# Patient Record
Sex: Female | Born: 1980 | Race: Black or African American | Hispanic: No | Marital: Single | Smoking: Never smoker
Health system: Southern US, Community
[De-identification: ages and names within clinical notes are randomized; demographics above are authoritative.]

## PROBLEM LIST (undated history)

## (undated) DIAGNOSIS — D219 Benign neoplasm of connective and other soft tissue, unspecified: Secondary | ICD-10-CM

## (undated) DIAGNOSIS — E785 Hyperlipidemia, unspecified: Secondary | ICD-10-CM

## (undated) DIAGNOSIS — D259 Leiomyoma of uterus, unspecified: Secondary | ICD-10-CM

## (undated) DIAGNOSIS — N920 Excessive and frequent menstruation with regular cycle: Secondary | ICD-10-CM

## (undated) HISTORY — DX: Hyperlipidemia, unspecified: E78.5

## (undated) HISTORY — DX: Leiomyoma of uterus, unspecified: D25.9

## (undated) HISTORY — DX: Excessive and frequent menstruation with regular cycle: N92.0

## (undated) HISTORY — DX: Benign neoplasm of connective and other soft tissue, unspecified: D21.9

---

## 2010-01-28 ENCOUNTER — Ambulatory Visit: Payer: Self-pay | Admitting: Family Medicine

## 2011-04-10 HISTORY — PX: ENDOMETRIAL BIOPSY: SHX622

## 2011-06-13 ENCOUNTER — Ambulatory Visit: Payer: Self-pay | Admitting: Obstetrics and Gynecology

## 2011-10-24 HISTORY — PX: UTERINE FIBROID SURGERY: SHX826

## 2012-07-20 ENCOUNTER — Emergency Department: Payer: Self-pay | Admitting: *Deleted

## 2012-07-20 LAB — URINALYSIS, COMPLETE
Bilirubin,UR: NEGATIVE
Blood: NEGATIVE
Glucose,UR: NEGATIVE mg/dL (ref 0–75)
Ketone: NEGATIVE
Ph: 5 (ref 4.5–8.0)
RBC,UR: 2 /HPF (ref 0–5)
Squamous Epithelial: 13
WBC UR: 17 /HPF (ref 0–5)

## 2012-07-20 LAB — COMPREHENSIVE METABOLIC PANEL
Albumin: 3.2 g/dL — ABNORMAL LOW (ref 3.4–5.0)
Alkaline Phosphatase: 63 U/L (ref 50–136)
Anion Gap: 9 (ref 7–16)
BUN: 10 mg/dL (ref 7–18)
Calcium, Total: 8.5 mg/dL (ref 8.5–10.1)
Creatinine: 1.08 mg/dL (ref 0.60–1.30)
Glucose: 89 mg/dL (ref 65–99)
Potassium: 3.8 mmol/L (ref 3.5–5.1)
SGOT(AST): 17 U/L (ref 15–37)
SGPT (ALT): 17 U/L (ref 12–78)
Total Protein: 8 g/dL (ref 6.4–8.2)

## 2012-07-20 LAB — LIPASE, BLOOD: Lipase: 116 U/L (ref 73–393)

## 2012-07-20 LAB — CBC
HCT: 37.1 % (ref 35.0–47.0)
HGB: 12.1 g/dL (ref 12.0–16.0)
MCH: 27.3 pg (ref 26.0–34.0)
MCHC: 32.6 g/dL (ref 32.0–36.0)
MCV: 84 fL (ref 80–100)

## 2012-07-20 LAB — PREGNANCY, URINE: Pregnancy Test, Urine: NEGATIVE m[IU]/mL

## 2012-08-19 ENCOUNTER — Ambulatory Visit: Payer: Self-pay | Admitting: Obstetrics and Gynecology

## 2012-08-30 ENCOUNTER — Inpatient Hospital Stay: Payer: Self-pay | Admitting: Obstetrics and Gynecology

## 2012-08-31 LAB — HEMATOCRIT: HCT: 32.8 % — ABNORMAL LOW (ref 35.0–47.0)

## 2012-09-02 LAB — PATHOLOGY REPORT

## 2014-01-20 IMAGING — CT CT ABD-PELV W/O CM
1 of 2 series · 15 of 32 positions shown, 19 images · non-contrast
Comparison: None

REASON FOR EXAM: (1) pain; (2) pain
COMMENTS:

PROCEDURE:     CT  - CT ABDOMEN AND PELVIS W[DATE]  [DATE]
RESULT:     Indication: Pain
TECHNIQUE: Multiple axial images from the lung bases to the symphysis pubis
were obtained without oral and without intravenous contrast.

[Series 2: 3mm soft tissue · axial · 0.84mm/px · z∈[-376,+72]mm · 15 of 163 slices shown, 19 images]
[im 7/163  soft-tissue]
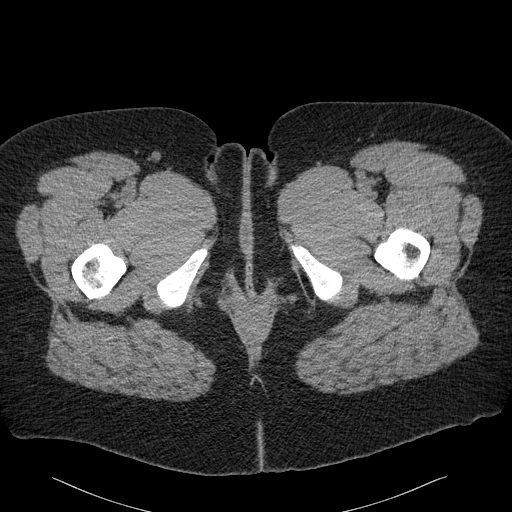
[im 7/163  bone]
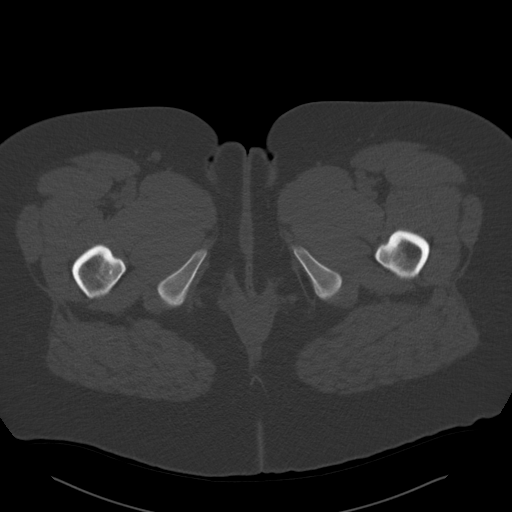
[im 21/163  soft-tissue]
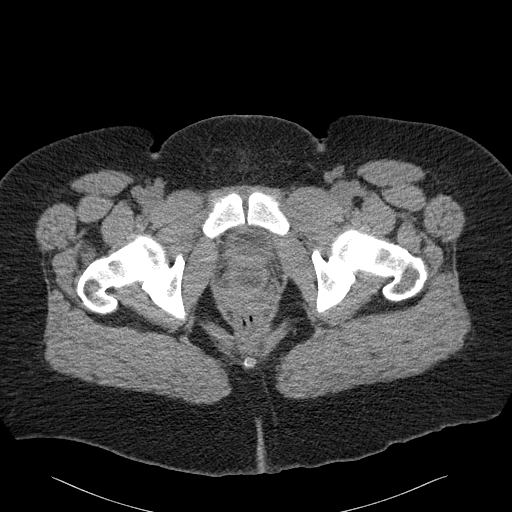
[im 34/163  soft-tissue]
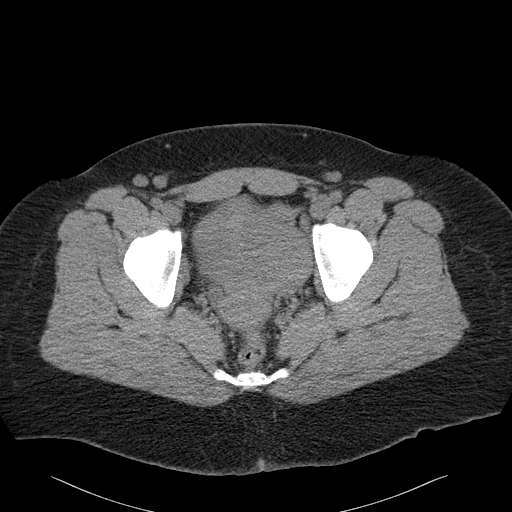
[im 48/163  soft-tissue]
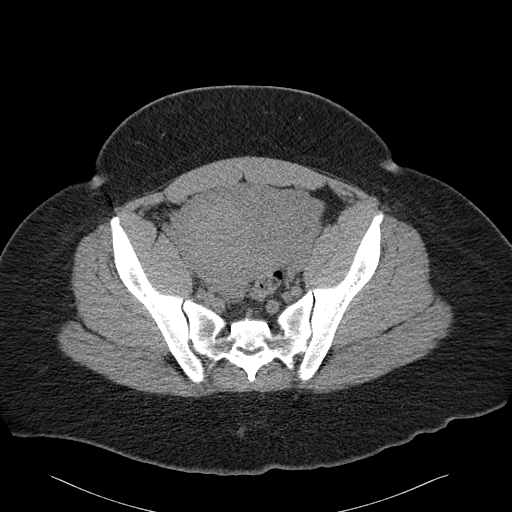
[im 55/163  soft-tissue]
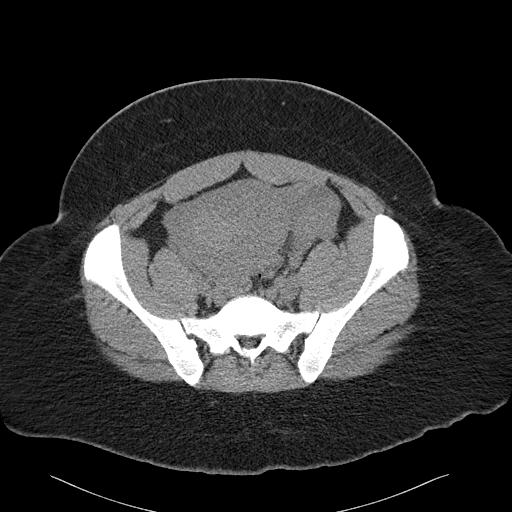
[im 68/163  soft-tissue]
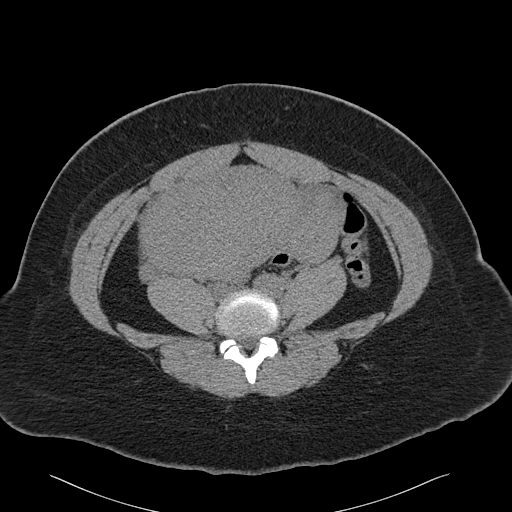
[im 82/163  soft-tissue]
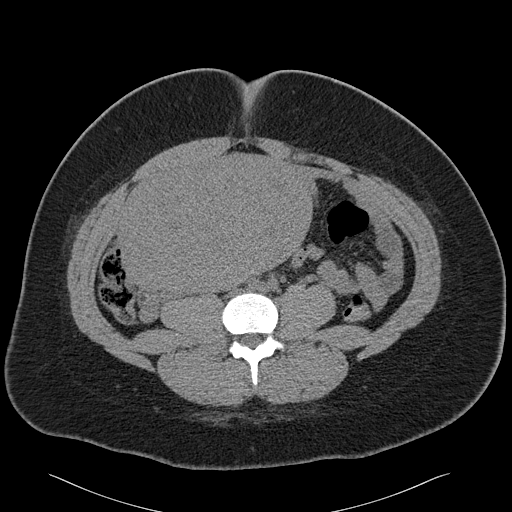
[im 95/163  soft-tissue]
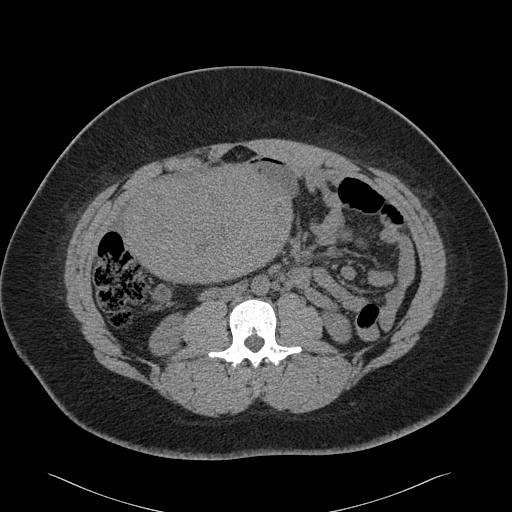
[im 109/163  soft-tissue]
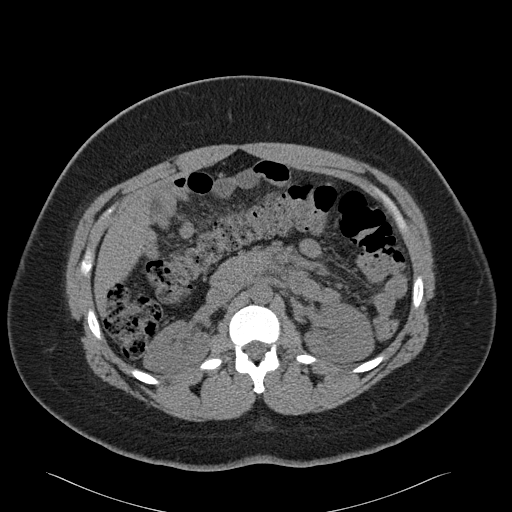
[im 109/163  bone]
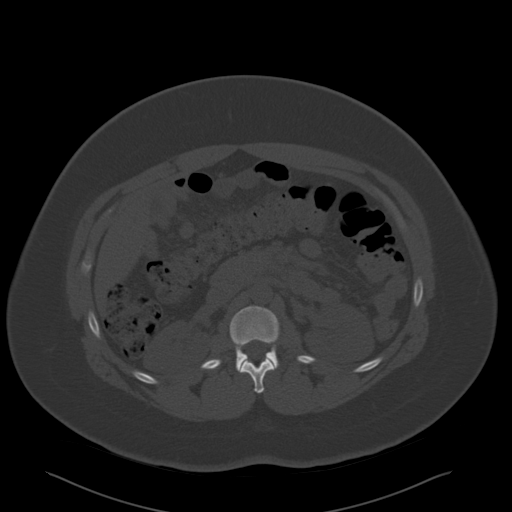
[im 115/163  soft-tissue]
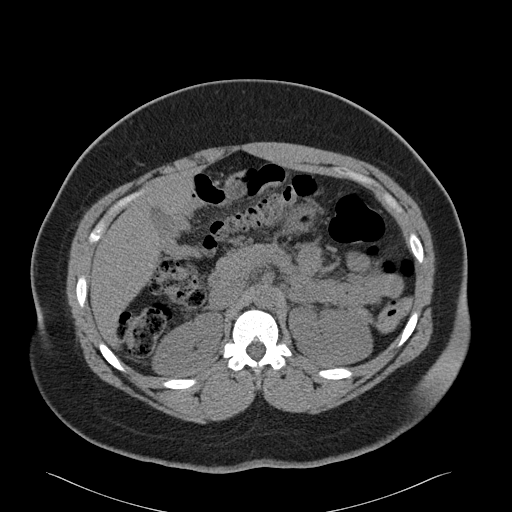
[im 129/163  soft-tissue]
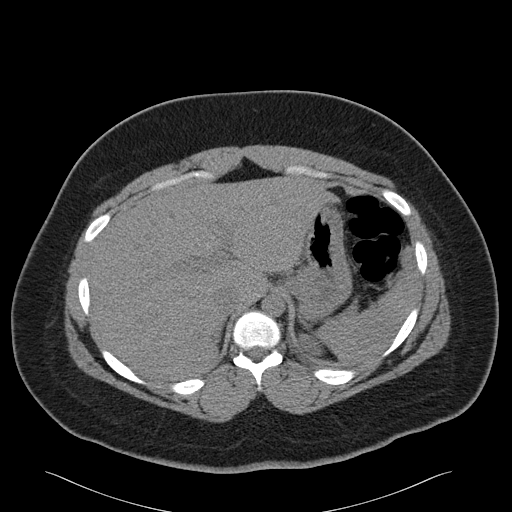
[im 136/163  lung]
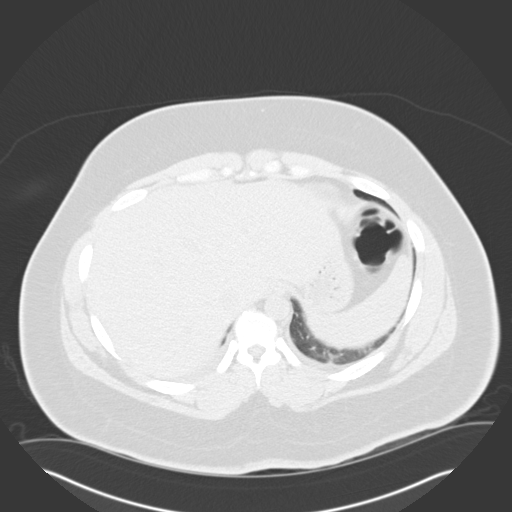
[im 142/163  soft-tissue]
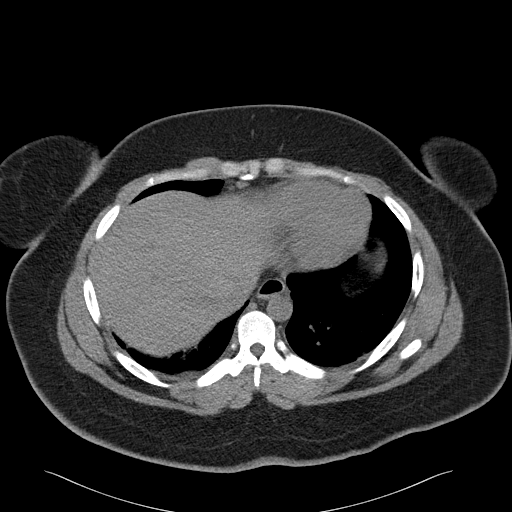
[im 142/163  lung]
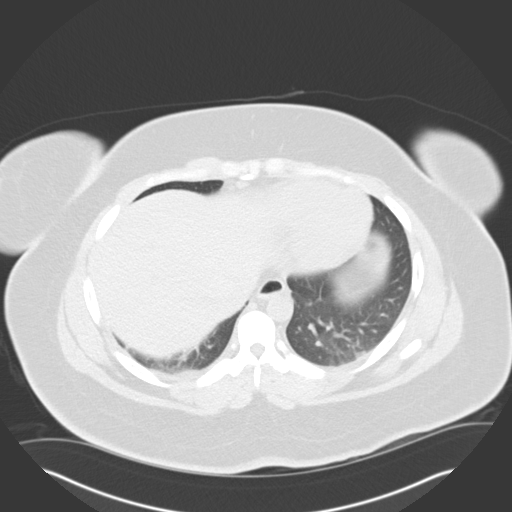
[im 149/163  lung]
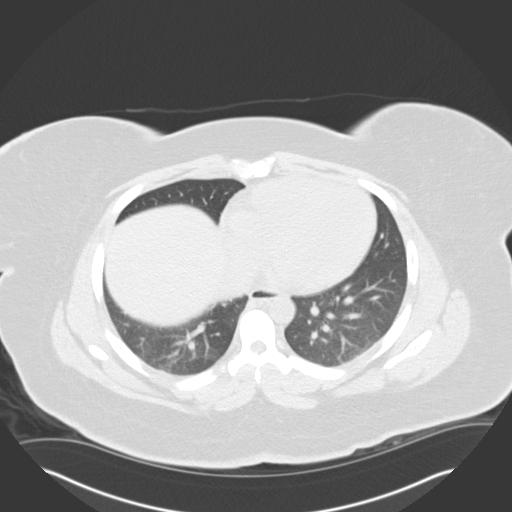
[im 156/163  soft-tissue]
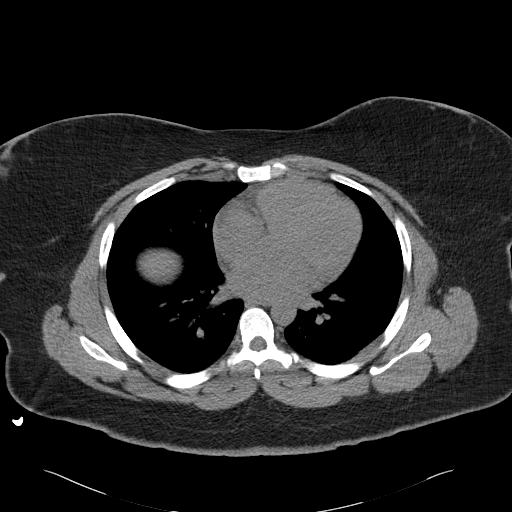
[im 156/163  lung]
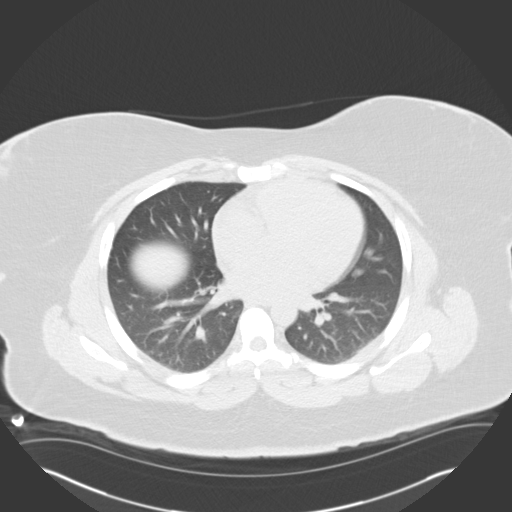

[15 of 32 positions shown; findings below may reference images not displayed]

FINDINGS: The lung bases are clear. There is no pleural or pericardial effusions.

No renal, ureteral, or bladder calculi. No obstructive uropathy. No
perinephric stranding is seen. The kidneys are symmetric in size without
evidence for exophytic mass. The bladder is unremarkable.

The liver demonstrates no focal abnormality. The gallbladder is
unremarkable. The spleen demonstrates no focal abnormality. The adrenal
glands and pancreas are normal.

The unopacified stomach, duodenum, small intestine, and large intestine are
unremarkable, but evaluation is limited by lack of oral contrast. There is a
17 cm a exophytic uterine mass most concerning for a uterine fibroid. There
is a normal caliber appendix in the right lower quadrant without
periappendiceal inflammatory changes. There multiple other smaller uterine
masses. There is no pneumoperitoneum, pneumatosis, or portal venous gas.
There is no abdominal or pelvic free fluid. There is no lymphadenopathy.

The abdominal aorta is normal in caliber .

The osseous structures are unremarkable.
IMPRESSION: 1. Multiple uterine leiomyomas. The largest measures 17 x 10 cm.

[REDACTED]

## 2015-02-09 NOTE — Op Note (Signed)
PATIENT NAME:  Shelley Lee, Shelley Lee MR#:  [id number] DATE OF BIRTH:  05-06-1981  DATE OF PROCEDURE:  08/30/2012  PREOPERATIVE DIAGNOSIS: Fibroids up to the xyphoid process pressing up on the liver causing menometrorrhagia unresponsive to medical management with wanting to preserve the uterus.   POSTOPERATIVE DIAGNOSIS: Fibroids up to the xyphoid process pressing up on the liver causing menometrorrhagia unresponsive to medical management with wanting to preserve the uterus.     PROCEDURE: Open myomectomy.   SURGEON: Erik Obey, MD.   ASSISTANT: Verlene Mayer, MD.   ANESTHESIA:  General endotracheal.   ESTIMATED BLOOD LOSS: Approximately 200 mL.   FINDINGS: Large fibroids weighing between two to three pounds growing up the back side of the uterus with two others, one embedded in the posterior aspect of the uterus, one embedded in the anterior aspect of the uterus and one going off the side in the shape of a heart along with approximately five other small one-inch fibroids.   DESCRIPTION OF PROCEDURE:  The patient was taken to the Operating Room and placed in supine position. After adequate general endotracheal anesthesia was instilled, the patient was prepped and draped in the usual sterile fashion. The fibroids are palpated under anesthesia. A vertical incision was made halfway to the umbilicus. The incision was carried sharply down to the fascia. The fascia was nicked in the midline. The incision was extended in a superior to inferior direction. The edges of the fascia were grasped and the muscle bellies were sharply and bluntly dissected off the fascia until the midline was identified. Midline was identified and the incision was extended. The peritoneum was grasped, cut and entered. The patient was placed in Trendelenburg. Attempts were made to remove the uterus and fibroids from the body. With some struggling, eventually the fibroid with the use of cat claw graspers was able to be hoisted out of  the abdomen. The bowels were packed back densely with moist laparotomy sponges and the uterus was inspected. The areas where the uterus and fibroids were connected were injected with a dilute pitressinsolution to decrease in blood vessels. Scalpel was used with the Bovie to remove the large posterior fibroid and the second heart-shaped fibroid. Attention was then turned to the posterior fibroid that was subserosal and the uterine incision was made with a knife. The fibroid was grasped with a towel clamp and with the backside of a scalpel handle, the fibroid was sharply and bluntly dissected out of its pseudocapsule. Good hemostasis was identified. The anterior fibroid was then identified and again, the uterine incision was made in a vertical manner through the muscle layers to the fibroid. The fibroid was grasped with the towel clapm clip and was sharply and bluntly dissected out of the pseudocapsule. The other side wall small 1 inch fibroids were also identified and cut out of their intramural location. Attention was then turned to the openings where the fibroids were at the anterior and the posterior aspects. Deep sutures were placed with first deep running Vicryl suture to approximate the dead space. Then serial sutures were placed to approximate the second layer. Additional muscle layers were seen and approximated with serial sutures, identified and pulled together. The last layer of each defect in the uterus was used to approximate the muscles at the level of the serosa. Attention was then turned to the back side where the large uterus fibroid had been removed. There was a small amount of endometrium visible. Sutures were used with 4-0 chromic sutures to invert this  tissue back into the uterus and the backside was approximated to cover the defect with two figure-of-eight sutures. Incisions of the patient were identified and the first baseball sutures were placed with the 4-0 Monocryl. Good hemostasis identified  at the end and the uterus looked wonderful with a T-shaped scar identified with one in the anterior aspect of the uterus. The uterus was wrapped in Seprafilm and placed back into the pelvis after copious irrigation with warm normal saline. The patient's bowels were allowed to then fall back over the uterus. All laps were removed. Counts were correct. The peritoneum was approximated as was the muscle bellies and the On-Q pain pump system was placed one on each side of the incision. The fascia was then closed with two PDS sutures. Plain gut suture was then used to approximate the subcutaneous fat and absorbable staple sutures were placed in the upper third to two-thirds of the uterine incision. However, due to the angle of the patient's belly, not able to be done in the lower incision area, so conventional staples were used in this area. Bandage was placed and the patient was then found to be hemostatic on the incision. Clear urine was noted in the Foley bag. The patient was then taken to recovery after having tolerated the procedure well.   ____________________________ Delsa Sale, MD cck:ap D: 09/05/2012 08:35:02 ET T: 09/05/2012 10:58:11 ET JOB#: [id number] cc: Delsa Sale, MD, <Dictator> Delsa Sale MD ELECTRONICALLY SIGNED 09/06/2012 13:12

## 2016-02-01 ENCOUNTER — Ambulatory Visit (INDEPENDENT_AMBULATORY_CARE_PROVIDER_SITE_OTHER): Payer: BC Managed Care – PPO | Admitting: Obstetrics and Gynecology

## 2016-02-01 ENCOUNTER — Encounter: Payer: Self-pay | Admitting: Obstetrics and Gynecology

## 2016-02-01 VITALS — BP 106/64 | HR 72 | Ht 65.0 in | Wt 292.9 lb

## 2016-02-01 DIAGNOSIS — N920 Excessive and frequent menstruation with regular cycle: Secondary | ICD-10-CM

## 2016-02-01 DIAGNOSIS — Z86018 Personal history of other benign neoplasm: Secondary | ICD-10-CM

## 2016-02-01 DIAGNOSIS — S20111A Abrasion of breast, right breast, initial encounter: Secondary | ICD-10-CM

## 2016-02-01 DIAGNOSIS — Z8742 Personal history of other diseases of the female genital tract: Secondary | ICD-10-CM | POA: Diagnosis not present

## 2016-02-01 HISTORY — DX: Excessive and frequent menstruation with regular cycle: N92.0

## 2016-02-01 NOTE — Progress Notes (Signed)
GYNECOLOGY CLINIC PROGRESS NOTE  Subjective:     Patient ID: Shelley Lee is a 35 y.o. G0P0 female who presents to establish care and has the following complaints today:   Chief Complaint: Dysfunctional Uterine Bleeding Patient complains of heavy menses over the past 5-6 months. Menstrual cycles last 6 days. She notes the heaviest bleeding within the first 1-2 days, using approximately 7-8 super pads per day. Clots are quarter sized. Dysmenorrhea:moderate, occurring first 1-2 days of flow. Takes Aleve for relief. Cyclic symptoms include: none. Current contraception: none.  Of note, patient has a history of large fibroids, with abdominal myomectomy performed in 2013. Thinks that her fibroids have returned as she is having similar symptoms as she did in the past.    Additional Complaints: Breast pain Patient presents for evaluation of a breast pain for several months. Pain is non-cyclical, bilaterally, and is located in axillary regions and underneath the breasts.  Patient notes that she has changed bras (now trying to use more sports bras and alternate with regular bras), and has ensured adequate bra sizing (was recently refitted).  Notes that this helped for a short time, however the pain still persists.  Denies personal or family history of breast cancer.   Menstrual History: OB History    Gravida Para Term Preterm AB TAB SAB Ectopic Multiple Living   0 0 0 0 0 0 0 0 0 0       Menarche age:40  Patient's last menstrual period was 02/01/2016 (exact date). Last pap smear ~ 2 years ago, normal.  Denies h/o abnormal pap smears.  Denies h/o STIs.    Past Medical History  Diagnosis Date  . Fibroid     History reviewed. No pertinent family history.  Past Surgical History  Procedure Laterality Date  . Uterine fibroid surgery  2013    Social History   Social History  . Marital Status: Single    Spouse Name: N/A  . Number of Children: N/A  . Years of Education: N/A    Occupational History  . Not on file.   Social History Main Topics  . Smoking status: Never Smoker   . Smokeless tobacco: Not on file  . Alcohol Use: Yes     Comment: Once weekly   . Drug Use: No  . Sexual Activity: Yes    Birth Control/ Protection: Condom   Other Topics Concern  . Not on file   Social History Narrative  . No narrative on file    . No outpatient encounter prescriptions on file as of 02/01/2016.   No facility-administered encounter medications on file as of 02/01/2016.     No Known Allergies   Review of Systems Pertinent items noted in HPI and remainder of comprehensive ROS otherwise negative.    Objective:    BP 106/64 mmHg  Pulse 72  Ht 5\' 5"  (1.651 m)  Wt 292 lb 14.4 oz (132.859 kg)  BMI 48.74 kg/m2  LMP 02/01/2016 (Exact Date) General appearance: alert and no distress, morbidly obese Breasts: No nipple retraction or dimpling, No nipple discharge or bleeding, positive findings: ulceration of right breast near axillary region, ~ 1 cm with peeling of surrounding skin Abdomen: soft, non-tender; bowel sounds normal; no masses,  no organomegaly Pelvic: external genitalia normal, rectovaginal septum normal.  Vagina without discharge. Small amount of dark blood in vaginal vault.  Cervix normal appearing, no lesions and no motion tenderness.  Uterus mobile, nontender, enlarged ~ 12-14 week size.  Adnexae non-palpable,  nontender bilaterally.  Extremities: extremities normal, atraumatic, no cyanosis or edema Skin: Skin color, texture, turgor normal. No rashes or lesions except as noted in breast exam.  Neurologic: Grossly normal   Assessment:   Menorrhagia H/o fibroid uterus Morbid obesity Breast ulceration  Plan:   1) Patient has abnormal uterine bleeding . She has an abnormal exam, with an enlarged uterus, but otherwise no evidence of lesions.  Will order pelvic ultrasound to evaluate for any structural gynecologic abnormalities.  Patient with h/o  fibroid uterus s/p myomectomy.  Uterus enlarged, possibly with newly growing fibroids.  Given handout regarding abnormal uterine bleeding and fibroid uterus.  Will return in 2-3 weeks to discuss ultrasound results and treatment options.  2) Discussion had with patient regarding obesity as a potential contributing factor for abnormal bleeding.  3) Breast ulceration of right breast.  Advised on use of neosporin twice daily, and to keep area covered with bandage.  Also encouraged to get bra pads and to attempt to go bra-less if possible at times. Will f/u at next visit.    Rubie Maid, MD [facility]

## 2016-02-09 ENCOUNTER — Ambulatory Visit (INDEPENDENT_AMBULATORY_CARE_PROVIDER_SITE_OTHER): Payer: BC Managed Care – PPO

## 2016-02-09 DIAGNOSIS — N920 Excessive and frequent menstruation with regular cycle: Secondary | ICD-10-CM

## 2016-02-16 ENCOUNTER — Ambulatory Visit (INDEPENDENT_AMBULATORY_CARE_PROVIDER_SITE_OTHER): Payer: BC Managed Care – PPO | Admitting: Obstetrics and Gynecology

## 2016-02-16 ENCOUNTER — Encounter: Payer: Self-pay | Admitting: Obstetrics and Gynecology

## 2016-02-16 VITALS — BP 105/70 | HR 66 | Wt 291.4 lb

## 2016-02-16 DIAGNOSIS — D252 Subserosal leiomyoma of uterus: Secondary | ICD-10-CM

## 2016-02-16 DIAGNOSIS — N920 Excessive and frequent menstruation with regular cycle: Secondary | ICD-10-CM | POA: Diagnosis not present

## 2016-02-16 MED ORDER — LEVONORGEST-ETH ESTRAD 91-DAY 0.15-0.03 MG PO TABS
1.0000 | ORAL_TABLET | Freq: Every day | ORAL | Status: DC
Start: 2016-02-16 — End: 2017-03-05

## 2016-02-18 ENCOUNTER — Encounter: Payer: Self-pay | Admitting: Obstetrics and Gynecology

## 2016-02-18 DIAGNOSIS — D259 Leiomyoma of uterus, unspecified: Secondary | ICD-10-CM | POA: Insufficient documentation

## 2016-02-18 HISTORY — DX: Leiomyoma of uterus, unspecified: D25.9

## 2016-02-18 NOTE — Progress Notes (Signed)
    GYNECOLOGY PROGRESS NOTE  Subjective:    Patient ID: Shelley Lee, female    DOB: Aug 10, 1981, 35 y.o.   MRN: [id number]  HPI  Patient is a 35 y.o. G0P0000 female who presents for f/u after ultrasound for complaints of menorrhagia with regular cycle. Denies complaints today.   The following portions of the patient's history were reviewed and updated as appropriate: allergies, current medications, past family history, past medical history, past social history, past surgical history and problem list.  Review of Systems Pertinent items noted in HPI and remainder of comprehensive ROS otherwise negative.   Objective:   Blood pressure 105/70, pulse 66, weight 291 lb 7 oz (132.195 kg), last menstrual period 02/01/2016. General appearance: alert and no distress Remainder of exam deferred.     Imaging (02/09/2016):  Indications: Menorrhagia with regular cycles Findings:  The uterus measures 10.6 x 6.5 x 8.4 cm, enlarged (nullipara). Echo texture is diffusely heterogenous with evidence of at least 5 focal masses. Within the uterus are multiple suspected fibroids measuring (largest 3 measured): Fibroid 1: Posterior midline, appears subserosal, 2.7 x 2.5 x 2.5 cm with internal flow seen. Fibroid 2: Posterior, right, possibly submucosal as it appears to be pushing the endometrium anteriorly, 5.4 x 4.9 x 5.4 cm with internal flow seen. Fibroid 3: Anterior left, appears subserosal, 2.5 x 2.1 x 2.0 cm, also with internal flow seen.  The Endometrium appears thin and measures 3.3 mm.  Right Ovary measures 3.1 x 1.8 x 2.6 cm, and appears WNL. Left Ovary measures 3.7 x 2.4 x 3.0 cm, and appears WNL. Survey of the adnexa demonstrates no adnexal masses. There is no free fluid in the cul de sac.  Impression: 1. Enlarged uterus with multiple fibroids seen. At least one appears to possibly be submucosal which may account for patient's menorrhagia.  2. Both ovaries visualized and appear  WNL.  Assessment:   Menorrhagia with regular cycle Fibroid uterus  Plan:   - Discussion had with patient regarding fibroid uterus, likely cause of heavy menses.  Discussed management options of fibroid uterus with heavy cycles.  Patient has abnormal uterine bleeding . She has a normal exam, no evidence of lesions.  Options for abnormal uterine bleeding includec tranexamic acid (Lysteda), OCPs, Depo Provera, Mirena IUD, endometrial ablation (Novasure/Hydrothermal Ablation) or hysterectomy as definitive surgical management.  Discussed risks and benefits of each method.   Patient desires OCPs.  Desires to do continuous regimen.  Prescribed Seasonale. To begin Sunday start after next menstrual cycle.  - To f/u in 3 months.    A total of 15 minutes were spent face-to-face with the patient during this encounter and over half of that time dealt with counseling and coordination of care.   Rubie Maid, MD [facility]

## 2016-05-23 ENCOUNTER — Ambulatory Visit: Payer: BC Managed Care – PPO | Admitting: Obstetrics and Gynecology

## 2016-06-07 ENCOUNTER — Encounter: Payer: Self-pay | Admitting: Obstetrics and Gynecology

## 2016-06-07 ENCOUNTER — Ambulatory Visit (INDEPENDENT_AMBULATORY_CARE_PROVIDER_SITE_OTHER): Payer: BC Managed Care – PPO | Admitting: Obstetrics and Gynecology

## 2016-06-07 VITALS — BP 111/72 | HR 75 | Ht 65.0 in | Wt 298.3 lb

## 2016-06-07 DIAGNOSIS — N920 Excessive and frequent menstruation with regular cycle: Secondary | ICD-10-CM

## 2016-06-07 DIAGNOSIS — Z6841 Body Mass Index (BMI) 40.0 and over, adult: Secondary | ICD-10-CM

## 2016-06-07 DIAGNOSIS — D259 Leiomyoma of uterus, unspecified: Secondary | ICD-10-CM

## 2016-06-07 NOTE — Progress Notes (Signed)
    GYNECOLOGY PROGRESS NOTE  Subjective:    Patient ID: Shelley Lee, female    DOB: [birth year], 35 y.o.   MRN: [id number]  HPI  Patient is a 35 y.o. G0P0000 female who presents for 2 month f/u of contraception.  Patient was placed on continuous OCPs due to fibroid uterus and menorrhagia.  Patient notes that the pills have been working well, no complaints.  Had a cycle after 1st month of pills, however cycle was lighter and only lasted 4 days.  Has not had a cycle this month.   The following portions of the patient's history were reviewed and updated as appropriate: allergies, current medications, past family history, past medical history, past social history, past surgical history and problem list.  Review of Systems Pertinent items noted in HPI and remainder of comprehensive ROS otherwise negative.   Objective:   Blood pressure 111/72, pulse 75, height 5\' 5"  (1.651 m), weight 298 lb 4.8 oz (135.3 kg), last menstrual period 05/23/2016. Body mass index is 49.64 kg/m.  General appearance: alert and no distress Exam deferred today  Assessment:   Menorrhagia Fibroid uterus  Plan:   Patient doing well on continuous regimen OPCs.  To continue.   To f/u in 3-6 months for annual exam, or as needed if symptoms worsen.    Rubie Maid, MD [facility]

## 2016-09-07 ENCOUNTER — Ambulatory Visit (INDEPENDENT_AMBULATORY_CARE_PROVIDER_SITE_OTHER): Payer: BC Managed Care – PPO | Admitting: Obstetrics and Gynecology

## 2016-09-07 ENCOUNTER — Encounter: Payer: Self-pay | Admitting: Obstetrics and Gynecology

## 2016-09-07 VITALS — BP 110/69 | HR 90 | Ht 65.0 in | Wt 301.8 lb

## 2016-09-07 DIAGNOSIS — Z113 Encounter for screening for infections with a predominantly sexual mode of transmission: Secondary | ICD-10-CM

## 2016-09-07 DIAGNOSIS — Z8742 Personal history of other diseases of the female genital tract: Secondary | ICD-10-CM | POA: Diagnosis not present

## 2016-09-07 DIAGNOSIS — Z86018 Personal history of other benign neoplasm: Secondary | ICD-10-CM | POA: Diagnosis not present

## 2016-09-07 DIAGNOSIS — Z01419 Encounter for gynecological examination (general) (routine) without abnormal findings: Secondary | ICD-10-CM

## 2016-09-07 DIAGNOSIS — Z6841 Body Mass Index (BMI) 40.0 and over, adult: Secondary | ICD-10-CM | POA: Diagnosis not present

## 2016-09-07 NOTE — Progress Notes (Signed)
GYNECOLOGY ANNUAL PHYSICAL EXAM PROGRESS NOTE  Subjective:    Shelley Lee is a 35 y.o. G0P0 female who presents for an annual exam. The patient has no complaints today. The patient is not currently sexually active.The patient wears seatbelts: yes. The patient participates in regular exercise: no. Has the patient ever been transfused or tattooed?: no. The patient reports that there is not domestic violence in her life.    Gynecologic History Menarche age: 58 Patient's last menstrual period was 08/28/2016. Contraception: OCP (estrogen/progesterone) History of STI's: Denies Last Pap: 08/2014. Results were: normal.  Denies h/o abnormal pap smears.   Obstetric History   G0   P0   T0   P0   A0   L0    SAB0   TAB0   Ectopic0   Multiple0   Live Births0       Past Medical History:  Diagnosis Date  . Fibroid   . Menorrhagia with regular cycle 02/01/2016  . Uterine fibroid 02/18/2016    Past Surgical History:  Procedure Laterality Date  . UTERINE FIBROID SURGERY  2013    Family History  Problem Relation Age of Onset  . Diabetes Mother   . Pulmonary embolism Sister   . Clotting disorder Brother   . Breast cancer Paternal Aunt     Social History   Social History  . Marital status: Single    Spouse name: N/A  . Number of children: N/A  . Years of education: N/A   Occupational History  . Not on file.   Social History Main Topics  . Smoking status: Never Smoker  . Smokeless tobacco: Never Used  . Alcohol use Yes     Comment: Once weekly   . Drug use: No  . Sexual activity: Yes    Birth control/ protection: Condom, Pill   Other Topics Concern  . Not on file   Social History Narrative  . No narrative on file    Current Outpatient Prescriptions on File Prior to Visit  Medication Sig Dispense Refill  . levonorgestrel-ethinyl estradiol (SEASONALE,INTROVALE,JOLESSA) 0.15-0.03 MG tablet Take 1 tablet by mouth daily. 1 Package 4   No current facility-administered  medications on file prior to visit.     No Known Allergies   Review of Systems Constitutional: negative for chills, fatigue, fevers and sweats Eyes: negative for irritation, redness and visual disturbance Ears, nose, mouth, throat, and face: negative for hearing loss, nasal congestion, snoring and tinnitus Respiratory: negative for asthma, cough, sputum Cardiovascular: negative for chest pain, dyspnea, exertional chest pressure/discomfort, irregular heart beat, palpitations and syncope Gastrointestinal: negative for abdominal pain, change in bowel habits, nausea and vomiting Genitourinary: negative for abnormal menstrual periods, genital lesions, sexual problems and vaginal discharge, dysuria and urinary incontinence Integument/breast: negative for breast lump, breast tenderness and nipple discharge Hematologic/lymphatic: negative for bleeding and easy bruising Musculoskeletal:negative for back pain and muscle weakness Neurological: negative for dizziness, headaches, vertigo and weakness Endocrine: negative for diabetic symptoms including polydipsia, polyuria and skin dryness Allergic/Immunologic: negative for hay fever and urticaria        Objective:  Blood pressure 110/69, pulse 90, height 5\' 5"  (1.651 m), weight (!) 301 lb 12.8 oz (136.9 kg), last menstrual period 08/28/2016. Body mass index is 50.22 kg/m.  General Appearance:    Alert, cooperative, no distress, appears stated age, morbidly obese.   Head:    Normocephalic, without obvious abnormality, atraumatic  Eyes:    PERRL, conjunctiva/corneas clear, EOM's intact, both eyes  Ears:    Normal external ear canals, both ears  Nose:   Nares normal, septum midline, mucosa normal, no drainage or sinus tenderness  Throat:   Lips, mucosa, and tongue normal; teeth and gums normal  Neck:   Supple, symmetrical, trachea midline, no adenopathy; thyroid: no enlargement/tenderness/nodules; no carotid bruit or JVD  Back:     Symmetric, no  curvature, ROM normal, no CVA tenderness  Lungs:     Clear to auscultation bilaterally, respirations unlabored  Chest Wall:    No tenderness or deformity   Heart:    Regular rate and rhythm, S1 and S2 normal, no murmur, rub or gallop  Breast Exam:    No tenderness, masses, or nipple abnormality  Abdomen:     Soft, non-tender, bowel sounds active all four quadrants, no masses, no organomegaly. Large pannus.    Genitalia:    Pelvic:external genitalia normal, vagina without lesions, discharge, or tenderness, rectovaginal septum  normal. Cervix normal in appearance, no cervical motion tenderness, no adnexal masses or tenderness.  Uterus normal size, shape, mobile, regular contours, nontender.  Rectal:    Normal external sphincter.  No hemorrhoids appreciated. Internal exam not done.   Extremities:   Extremities normal, atraumatic, no cyanosis or edema  Pulses:   2+ and symmetric all extremities  Skin:   Skin color, texture, turgor normal, no rashes or lesions  Lymph nodes:   Cervical, supraclavicular, and axillary nodes normal  Neurologic:   CNII-XII intact, normal strength, sensation and reflexes throughout   .  Labs:  Lab Results  Component Value Date   WBC 9.8 07/20/2012   HGB 12.1 07/20/2012   HCT 32.8 (L) 08/31/2012   MCV 84 07/20/2012   PLT 341 07/20/2012    Lab Results  Component Value Date   CREATININE 1.08 07/20/2012   BUN 10 07/20/2012   NA 140 07/20/2012   K 3.8 07/20/2012   CL 107 07/20/2012   CO2 24 07/20/2012    Lab Results  Component Value Date   ALT 17 07/20/2012   AST 17 07/20/2012   ALKPHOS 63 07/20/2012   BILITOT 0.4 07/20/2012    No results found for: TSH   Assessment:    Healthy female exam.   Morbid obesity (Class III) H/o uterine fibroid H/o menorrhagia STD screen  Plan:     Blood tests: Basic metabolic panel, CBC with diff, Lipoproteins, TSH and Vitamin D levels, HgbA1c. Breast self exam technique reviewed and patient encouraged to perform  self-exam monthly. Contraception: OCP (estrogen/progesterone). Discussed healthy lifestyle modifications.  Patient has recently started a juice diet ~ 2 weeks ago (replaces 2 meals a day with a juice/shake supplement).  Also is trying to increase physical activity.  Pap smear up to date.  Next pap smear due 2018.   STD screening at patient's request.  Up to date on flu vaccine.  H/o uterine fibroids and menorrhagia controlled with OCPs.  Return to clinic in 1 year for annual exam, or sooner as needed    Rubie Maid, MD [facility]

## 2016-09-07 NOTE — Patient Instructions (Signed)

## 2016-09-08 ENCOUNTER — Other Ambulatory Visit: Payer: BC Managed Care – PPO

## 2016-09-09 LAB — CBC WITH DIFFERENTIAL/PLATELET
BASOS: 1 %
Basophils Absolute: 0 10*3/uL (ref 0.0–0.2)
EOS (ABSOLUTE): 0.1 10*3/uL (ref 0.0–0.4)
EOS: 2 %
HEMATOCRIT: 36.9 % (ref 34.0–46.6)
Hemoglobin: 11.7 g/dL (ref 11.1–15.9)
Immature Grans (Abs): 0 10*3/uL (ref 0.0–0.1)
Immature Granulocytes: 0 %
LYMPHS ABS: 1.8 10*3/uL (ref 0.7–3.1)
Lymphs: 29 %
MCH: 24.4 pg — ABNORMAL LOW (ref 26.6–33.0)
MCHC: 31.7 g/dL (ref 31.5–35.7)
MCV: 77 fL — AB (ref 79–97)
MONOS ABS: 0.4 10*3/uL (ref 0.1–0.9)
Monocytes: 6 %
Neutrophils Absolute: 3.9 10*3/uL (ref 1.4–7.0)
Neutrophils: 62 %
Platelets: 339 10*3/uL (ref 150–379)
RBC: 4.79 x10E6/uL (ref 3.77–5.28)
RDW: 15.5 % — AB (ref 12.3–15.4)
WBC: 6.2 10*3/uL (ref 3.4–10.8)

## 2016-09-09 LAB — LIPID PANEL
CHOL/HDL RATIO: 4 ratio (ref 0.0–4.4)
Cholesterol, Total: 161 mg/dL (ref 100–199)
HDL: 40 mg/dL (ref >39)
LDL CALC: 109 mg/dL — AB (ref 0–99)
TRIGLYCERIDES: 59 mg/dL (ref 0–149)
VLDL CHOLESTEROL CAL: 12 mg/dL (ref 5–40)

## 2016-09-09 LAB — THYROID PANEL WITH TSH
Free Thyroxine Index: 1.8 (ref 1.2–4.9)
T3 Uptake Ratio: 22 % — ABNORMAL LOW (ref 24–39)
T4, Total: 8.4 ug/dL (ref 4.5–12.0)
TSH: 1.7 u[IU]/mL (ref 0.450–4.500)

## 2016-09-09 LAB — HIV ANTIBODY (ROUTINE TESTING W REFLEX): HIV Screen 4th Generation wRfx: NONREACTIVE

## 2016-09-09 LAB — BASIC METABOLIC PANEL
BUN / CREAT RATIO: 12 (ref 9–23)
BUN: 13 mg/dL (ref 6–20)
CO2: 21 mmol/L (ref 18–29)
CREATININE: 1.11 mg/dL — AB (ref 0.57–1.00)
Calcium: 8.7 mg/dL (ref 8.7–10.2)
Chloride: 102 mmol/L (ref 96–106)
GFR calc non Af Amer: 65 mL/min/{1.73_m2} (ref >59)
GFR, EST AFRICAN AMERICAN: 75 mL/min/{1.73_m2} (ref >59)
GLUCOSE: 85 mg/dL (ref 65–99)
Potassium: 4.6 mmol/L (ref 3.5–5.2)
Sodium: 140 mmol/L (ref 134–144)

## 2016-09-09 LAB — HEMOGLOBIN A1C
Est. average glucose Bld gHb Est-mCnc: 105 mg/dL
Hgb A1c MFr Bld: 5.3 % (ref 4.8–5.6)

## 2016-09-09 LAB — VITAMIN D 25 HYDROXY (VIT D DEFICIENCY, FRACTURES): VIT D 25 HYDROXY: 30.7 ng/mL (ref 30.0–100.0)

## 2016-09-09 LAB — HEPATITIS B SURFACE ANTIGEN: Hepatitis B Surface Ag: NEGATIVE

## 2016-09-13 ENCOUNTER — Telehealth: Payer: Self-pay

## 2016-09-13 DIAGNOSIS — R7989 Other specified abnormal findings of blood chemistry: Secondary | ICD-10-CM

## 2016-09-13 NOTE — Telephone Encounter (Signed)
-----   Message from Rubie Maid, MD sent at 09/12/2016 10:50 AM EST ----- Please inform patient of slightly elevated LDL cholesterol (recommend low cholesterol diet and exercise), will repeat in 1 year.  Also with elevated Cr level (a sign of kidney function).  Could be due to diet (excess meats), decreased water intake, medications, or mild kidney dysfunction.  Would recommend repeating lab in 2-3 weeks with a full renal panel. Should also increase water intake and monitor diet.

## 2016-09-13 NOTE — Telephone Encounter (Signed)
Called pt no answer. Lm for pt informing her of the information below. Orders placed. Pt added to lab schedule

## 2017-03-05 ENCOUNTER — Other Ambulatory Visit: Payer: Self-pay | Admitting: Obstetrics and Gynecology

## 2017-09-11 ENCOUNTER — Encounter: Payer: BC Managed Care – PPO | Admitting: Obstetrics and Gynecology

## 2018-01-02 IMAGING — US US TRANSVAGINAL NON-OB
1 series · 14 of 25 positions shown · non-contrast
Comparison: none

[Series 1: us transvaginal non-ob · 0.23mm/px · 14 of 37 slices shown]
[im 1/37]
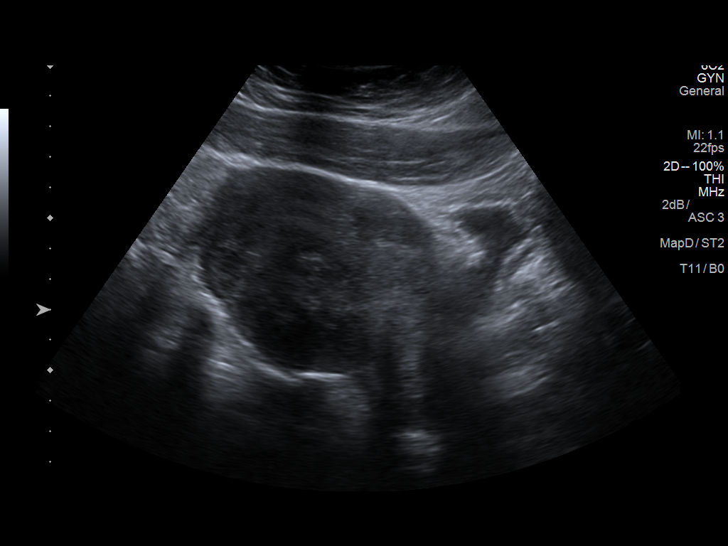
[im 4/37]
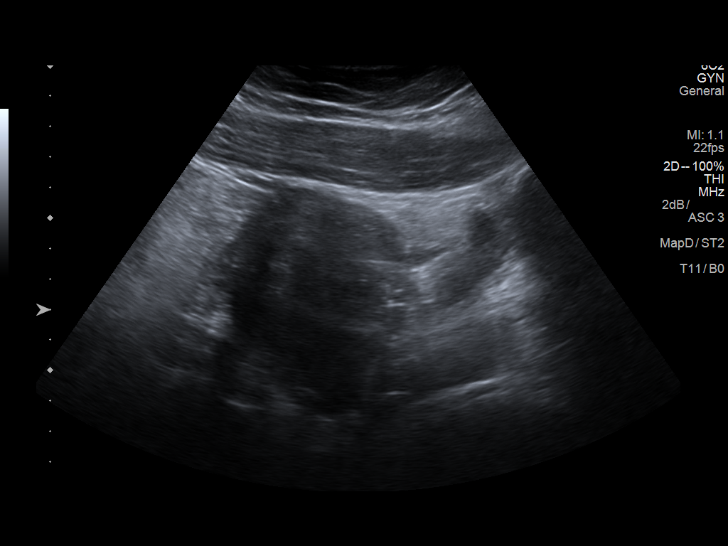
[im 7/37]
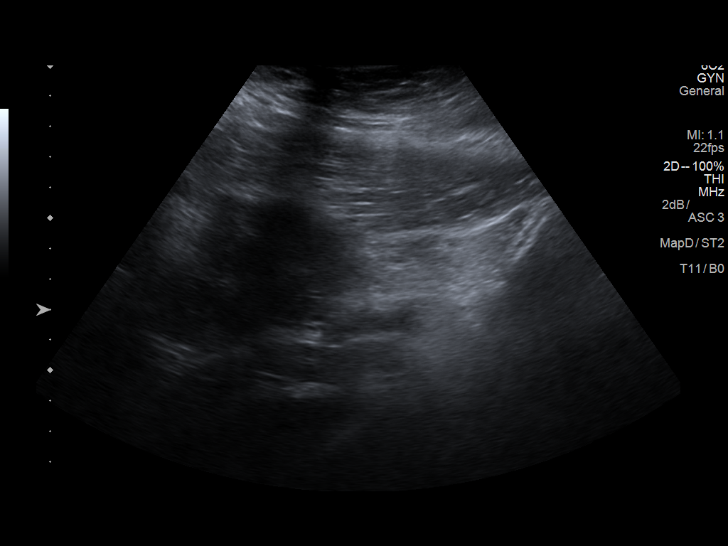
[im 10/37]
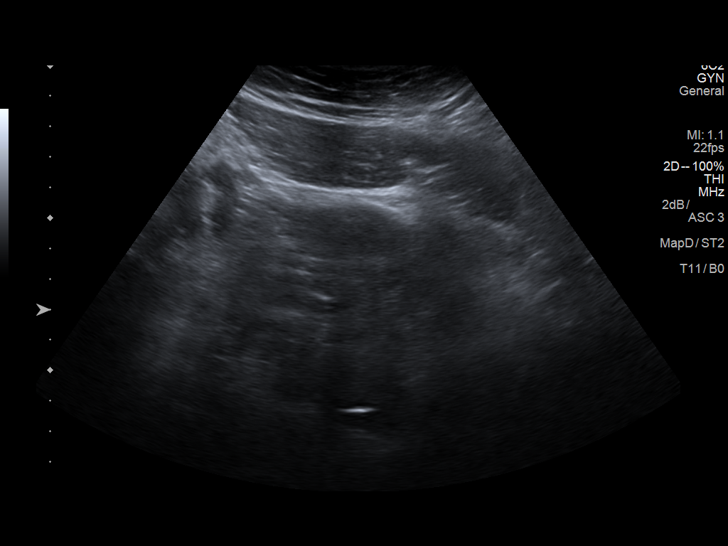
[im 13/37]
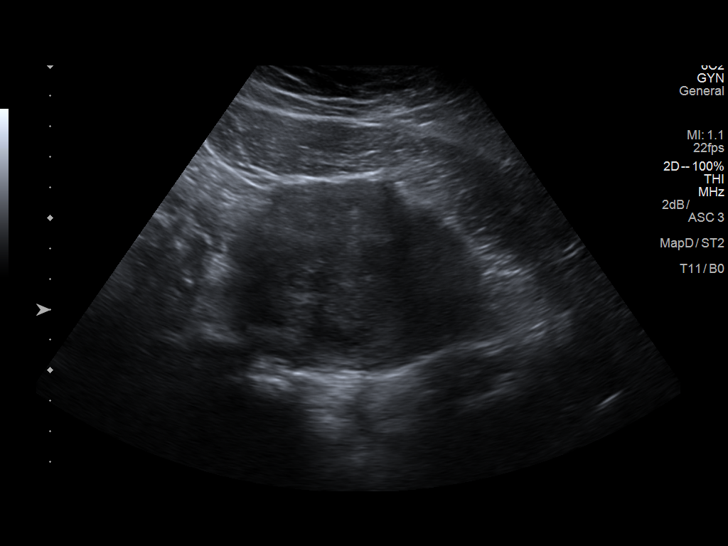
[im 14/37]
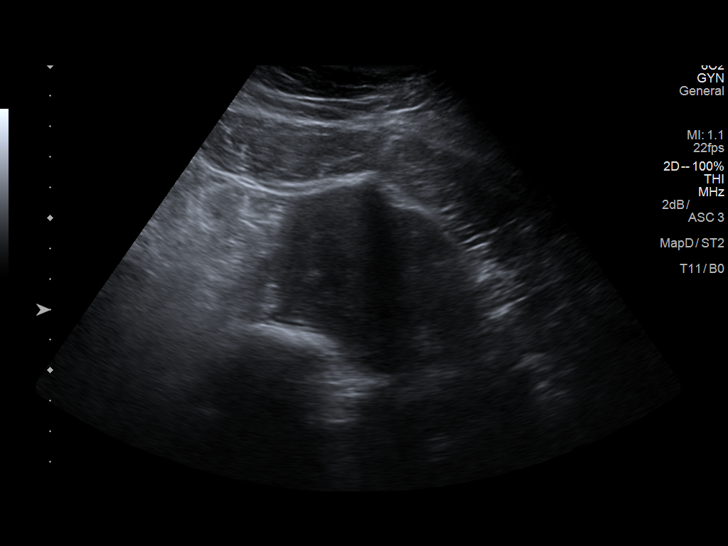
[im 17/37]
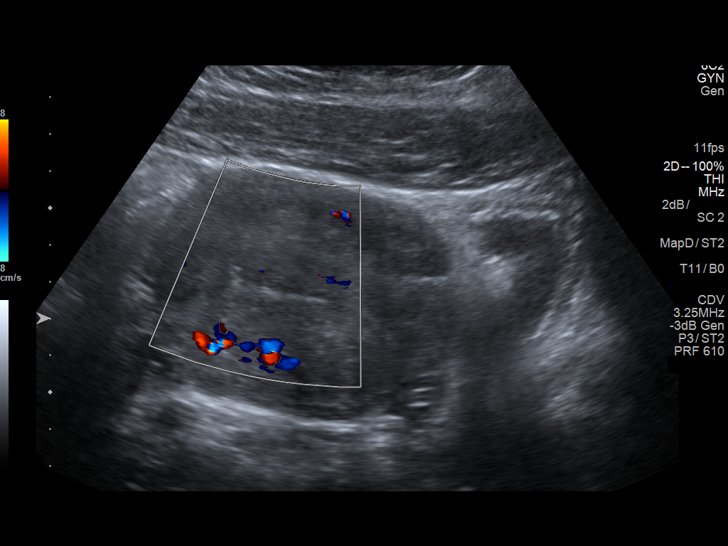
[im 20/37]
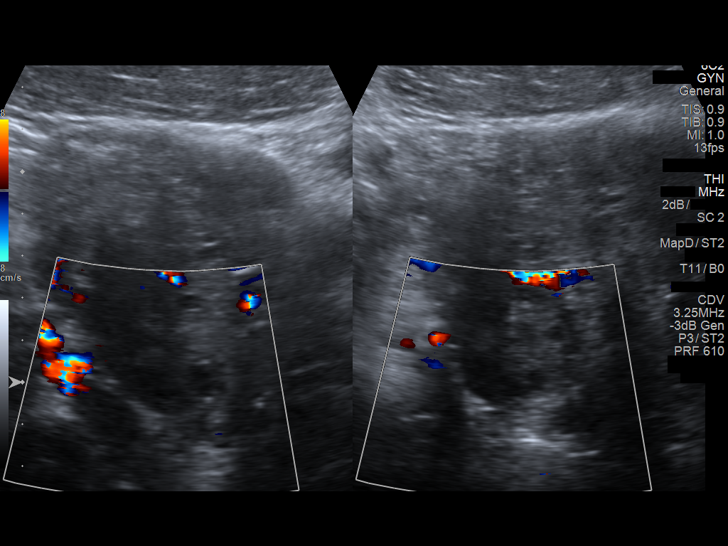
[im 23/37]
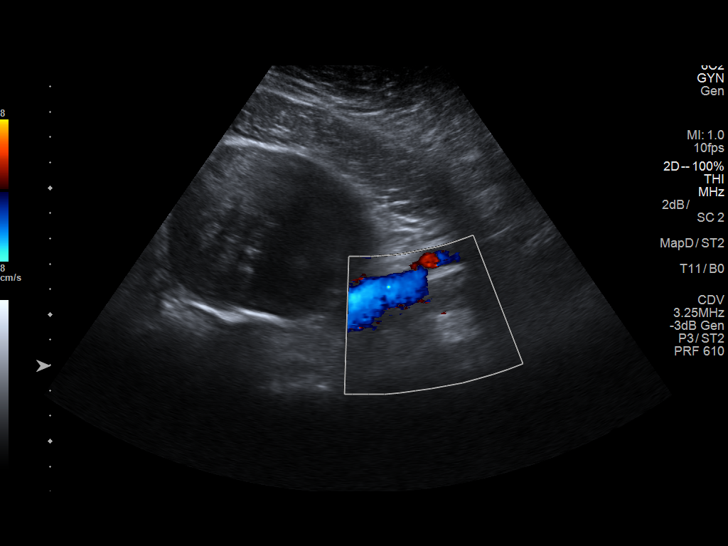
[im 25/37]
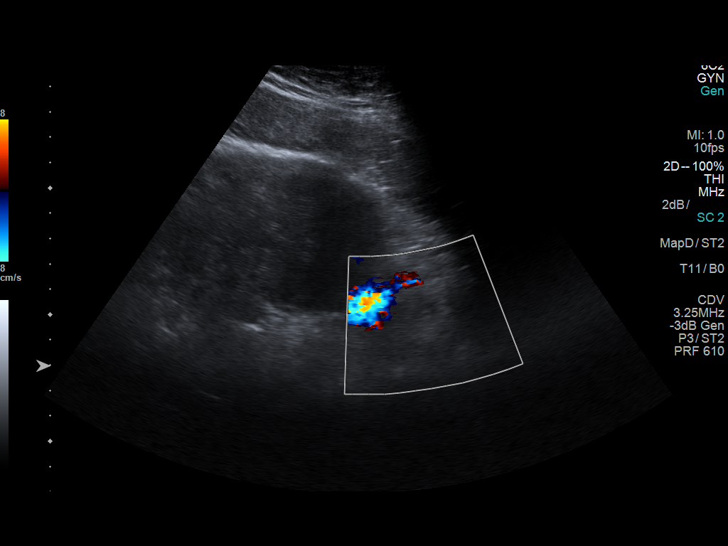
[im 28/37]
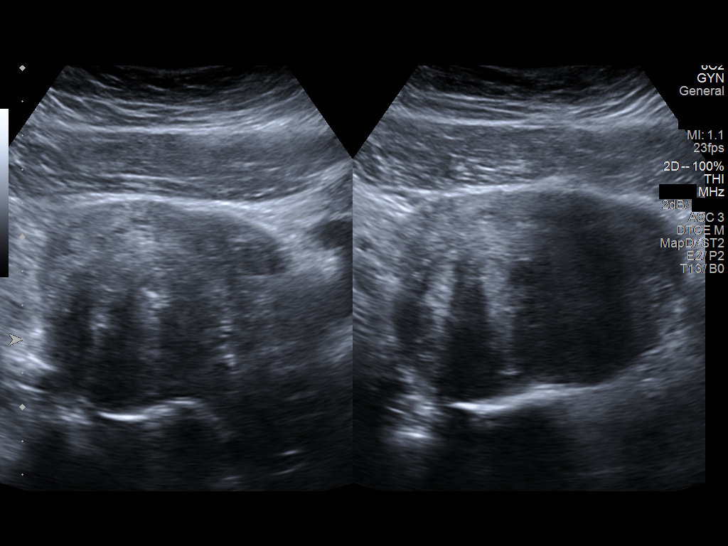
[im 31/37]
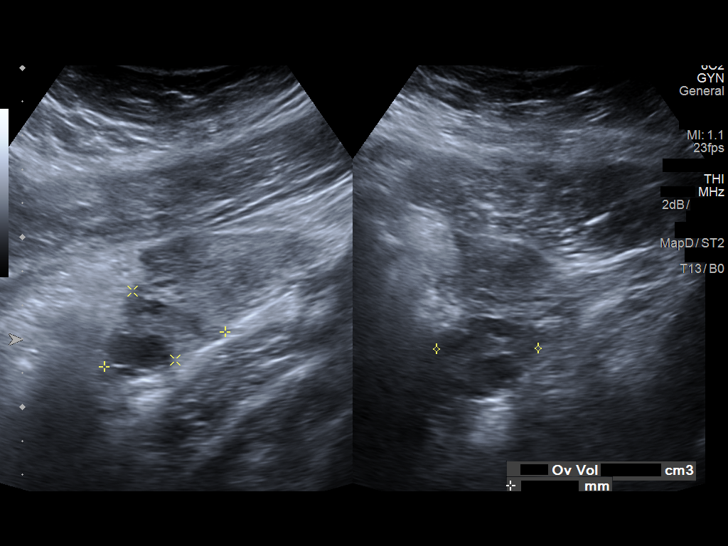
[im 34/37]
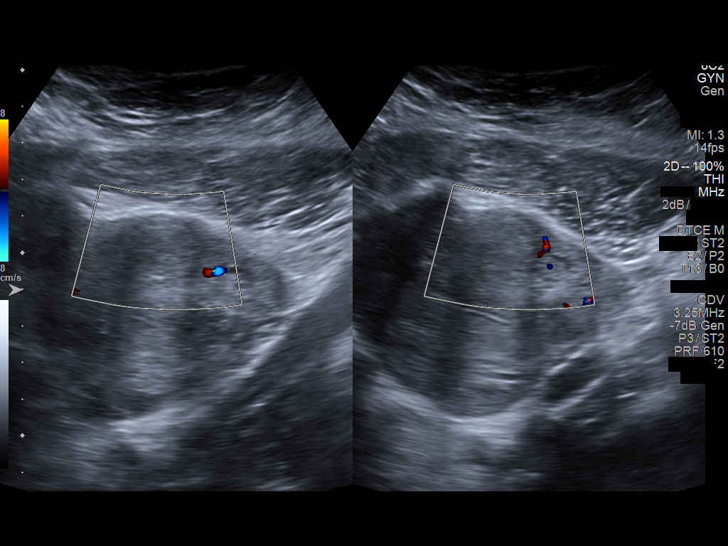
[im 37/37]
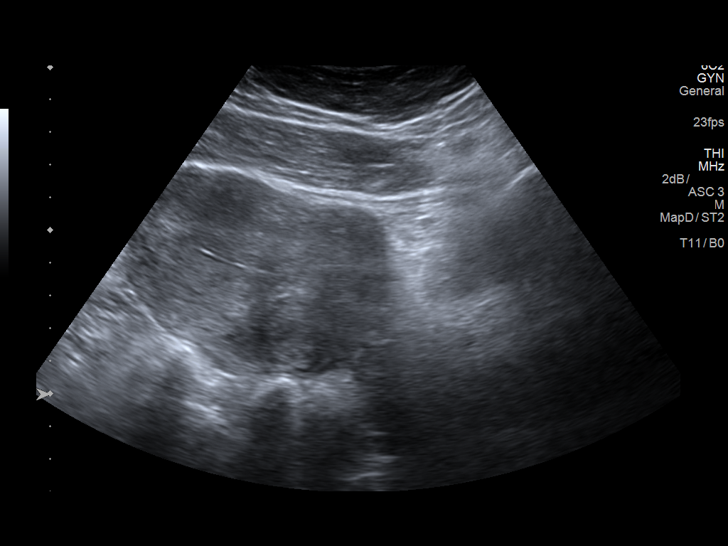

[14 of 25 positions shown; findings below may reference images not displayed]

Canned report from images found in remote index.

Refer to host system for actual result text.

## 2018-03-08 ENCOUNTER — Ambulatory Visit (INDEPENDENT_AMBULATORY_CARE_PROVIDER_SITE_OTHER): Payer: BC Managed Care – PPO | Admitting: Obstetrics and Gynecology

## 2018-03-08 ENCOUNTER — Encounter: Payer: Self-pay | Admitting: Obstetrics and Gynecology

## 2018-03-08 ENCOUNTER — Other Ambulatory Visit: Payer: Self-pay | Admitting: Obstetrics and Gynecology

## 2018-03-08 VITALS — BP 137/84 | HR 84 | Ht 65.0 in | Wt 311.0 lb

## 2018-03-08 DIAGNOSIS — Z131 Encounter for screening for diabetes mellitus: Secondary | ICD-10-CM | POA: Diagnosis not present

## 2018-03-08 DIAGNOSIS — Z124 Encounter for screening for malignant neoplasm of cervix: Secondary | ICD-10-CM | POA: Diagnosis not present

## 2018-03-08 DIAGNOSIS — Z113 Encounter for screening for infections with a predominantly sexual mode of transmission: Secondary | ICD-10-CM | POA: Diagnosis not present

## 2018-03-08 DIAGNOSIS — Z789 Other specified health status: Secondary | ICD-10-CM | POA: Diagnosis not present

## 2018-03-08 DIAGNOSIS — Z01419 Encounter for gynecological examination (general) (routine) without abnormal findings: Secondary | ICD-10-CM | POA: Diagnosis not present

## 2018-03-08 DIAGNOSIS — IMO0001 Reserved for inherently not codable concepts without codable children: Secondary | ICD-10-CM

## 2018-03-08 DIAGNOSIS — Z13220 Encounter for screening for lipoid disorders: Secondary | ICD-10-CM | POA: Diagnosis not present

## 2018-03-08 MED ORDER — LEVONORGEST-ETH ESTRAD 91-DAY 0.15-0.03 MG PO TABS: 1.0000 | ORAL_TABLET | Freq: Every day | ORAL | 3 refills | Status: DC

## 2018-03-08 NOTE — Patient Instructions (Signed)
Preventive Care 18-39 Years, Female Preventive care refers to lifestyle choices and visits with your health care provider that can promote health and wellness. What does preventive care include?  A yearly physical exam. This is also called an annual well check.  Dental exams once or twice a year.  Routine eye exams. Ask your health care provider how often you should have your eyes checked.  Personal lifestyle choices, including: ? Daily care of your teeth and gums. ? Regular physical activity. ? Eating a healthy diet. ? Avoiding tobacco and drug use. ? Limiting alcohol use. ? Practicing safe sex. ? Taking vitamin and mineral supplements as recommended by your health care provider. What happens during an annual well check? The services and screenings done by your health care provider during your annual well check will depend on your age, overall health, lifestyle risk factors, and family history of disease. Counseling Your health care provider may ask you questions about your:  Alcohol use.  Tobacco use.  Drug use.  Emotional well-being.  Home and relationship well-being.  Sexual activity.  Eating habits.  Work and work Statistician.  Method of birth control.  Menstrual cycle.  Pregnancy history.  Screening You may have the following tests or measurements:  Height, weight, and BMI.  Diabetes screening. This is done by checking your blood sugar (glucose) after you have not eaten for a while (fasting).  Blood pressure.  Lipid and cholesterol levels. These may be checked every 5 years starting at age 38.  Skin check.  Hepatitis C blood test.  Hepatitis B blood test.  Sexually transmitted disease (STD) testing.  BRCA-related cancer screening. This may be done if you have a family history of breast, ovarian, tubal, or peritoneal cancers.  Pelvic exam and Pap test. This may be done every 3 years starting at age 38. Starting at age 30, this may be done  every 5 years if you have a Pap test in combination with an HPV test.  Discuss your test results, treatment options, and if necessary, the need for more tests with your health care provider. Vaccines Your health care provider may recommend certain vaccines, such as:  Influenza vaccine. This is recommended every year.  Tetanus, diphtheria, and acellular pertussis (Tdap, Td) vaccine. You may need a Td booster every 10 years.  Varicella vaccine. You may need this if you have not been vaccinated.  HPV vaccine. If you are 39 or younger, you may need three doses over 6 months.  Measles, mumps, and rubella (MMR) vaccine. You may need at least one dose of MMR. You may also need a second dose.  Pneumococcal 13-valent conjugate (PCV13) vaccine. You may need this if you have certain conditions and were not previously vaccinated.  Pneumococcal polysaccharide (PPSV23) vaccine. You may need one or two doses if you smoke cigarettes or if you have certain conditions.  Meningococcal vaccine. One dose is recommended if you are age 68-21 years and a first-year college student living in a residence hall, or if you have one of several medical conditions. You may also need additional booster doses.  Hepatitis A vaccine. You may need this if you have certain conditions or if you travel or work in places where you may be exposed to hepatitis A.  Hepatitis B vaccine. You may need this if you have certain conditions or if you travel or work in places where you may be exposed to hepatitis B.  Haemophilus influenzae type b (Hib) vaccine. You may need this  if you have certain risk factors.  Talk to your health care provider about which screenings and vaccines you need and how often you need them. This information is not intended to replace advice given to you by your health care provider. Make sure you discuss any questions you have with your health care provider. Document Released: 12/05/2001 Document Revised:  06/28/2016 Document Reviewed: 08/10/2015 Elsevier Interactive Patient Education  2018 Elsevier Inc.  

## 2018-03-08 NOTE — Progress Notes (Signed)
Pt is present today for her annual exam. Pt denies any itching, burning, or discharge. Pt has no concerns.

## 2018-03-08 NOTE — Progress Notes (Signed)
GYNECOLOGY ANNUAL PHYSICAL EXAM PROGRESS NOTE  Subjective:    Shelley Lee is a 37 y.o. G0P0 female who presents for an annual exam. The patient has no complaints today. The patient is not currently sexually active.The patient wears seatbelts: yes. The patient participates in regular exercise: no. Has the patient ever been transfused or tattooed?: no. The patient reports that there is not domestic violence in her life.    Gynecologic History Menarche age: 75 No LMP recorded (lmp unknown). Cycles every 4 months.  Contraception: OCP (estrogen/progesterone), continuous regimen History of STI's: Denies Last Pap: 08/2014. Results were: normal.  Denies h/o abnormal pap smears.   OB History  Gravida Para Term Preterm AB Living  0 0 0 0 0 0  SAB TAB Ectopic Multiple Live Births  0 0 0 0 0    Past Medical History:  Diagnosis Date  . Fibroid   . Menorrhagia with regular cycle 02/01/2016  . Uterine fibroid 02/18/2016    Past Surgical History:  Procedure Laterality Date  . UTERINE FIBROID SURGERY  2013    Family History  Problem Relation Age of Onset  . Diabetes Mother   . Pulmonary embolism Sister   . Clotting disorder Brother   . Breast cancer Paternal Aunt     Social History   Socioeconomic History  . Marital status: Single    Spouse name: Not on file  . Number of children: Not on file  . Years of education: Not on file  . Highest education level: Not on file  Occupational History  . Not on file  Social Needs  . Financial resource strain: Not on file  . Food insecurity:    Worry: Not on file    Inability: Not on file  . Transportation needs:    Medical: Not on file    Non-medical: Not on file  Tobacco Use  . Smoking status: Never Smoker  . Smokeless tobacco: Never Used  Substance and Sexual Activity  . Alcohol use: Yes    Comment: Once weekly   . Drug use: No  . Sexual activity: Yes    Birth control/protection: Pill  Lifestyle  . Physical activity:    Days per week: Not on file    Minutes per session: Not on file  . Stress: Not on file  Relationships  . Social connections:    Talks on phone: Not on file    Gets together: Not on file    Attends religious service: Not on file    Active member of club or organization: Not on file    Attends meetings of clubs or organizations: Not on file    Relationship status: Not on file  . Intimate partner violence:    Fear of current or ex partner: Not on file    Emotionally abused: Not on file    Physically abused: Not on file    Forced sexual activity: Not on file  Other Topics Concern  . Not on file  Social History Narrative  . Not on file    Current Outpatient Medications on File Prior to Visit  Medication Sig Dispense Refill  . levonorgestrel-ethinyl estradiol (SEASONALE,INTROVALE,JOLESSA) 0.15-0.03 MG tablet TAKE 1 TABLET EVERY DAY 91 tablet 3   No current facility-administered medications on file prior to visit.     No Known Allergies   Review of Systems Constitutional: negative for chills, fatigue, fevers and sweats Eyes: negative for irritation, redness and visual disturbance Ears, nose, mouth, throat, and face:  negative for hearing loss, nasal congestion, snoring and tinnitus Respiratory: negative for asthma, cough, sputum Cardiovascular: negative for chest pain, dyspnea, exertional chest pressure/discomfort, irregular heart beat, palpitations and syncope Gastrointestinal: negative for abdominal pain, change in bowel habits, nausea and vomiting Genitourinary: negative for abnormal menstrual periods, genital lesions, sexual problems and vaginal discharge, dysuria and urinary incontinence Integument/breast: negative for breast lump, breast tenderness and nipple discharge Hematologic/lymphatic: negative for bleeding and easy bruising Musculoskeletal:negative for back pain and muscle weakness Neurological: negative for dizziness, headaches, vertigo and weakness Endocrine:  negative for diabetic symptoms including polydipsia, polyuria and skin dryness Allergic/Immunologic: negative for hay fever and urticaria        Objective:  Blood pressure 137/84, pulse 84, height 5\' 5"  (1.651 m), weight (!) 311 lb (141.1 kg). Body mass index is 51.75 kg/m.  General Appearance:    Alert, cooperative, no distress, appears stated age, morbidly obese.   Head:    Normocephalic, without obvious abnormality, atraumatic  Eyes:    PERRL, conjunctiva/corneas clear, EOM's intact, both eyes  Ears:    Normal external ear canals, both ears  Nose:   Nares normal, septum midline, mucosa normal, no drainage or sinus tenderness  Throat:   Lips, mucosa, and tongue normal; teeth and gums normal  Neck:   Supple, symmetrical, trachea midline, no adenopathy; thyroid: no enlargement/tenderness/nodules; no carotid bruit or JVD  Back:     Symmetric, no curvature, ROM normal, no CVA tenderness  Lungs:     Clear to auscultation bilaterally, respirations unlabored  Chest Wall:    No tenderness or deformity   Heart:    Regular rate and rhythm, S1 and S2 normal, no murmur, rub or gallop  Breast Exam:    No tenderness, masses, or nipple abnormality  Abdomen:     Soft, non-tender, bowel sounds active all four quadrants, no masses, no organomegaly. Large pannus.    Genitalia:    Pelvic:external genitalia normal, vagina without lesions. Scant thick white discharge, no odor.  No tenderness, rectovaginal septum  normal. Cervix normal in appearance, no cervical motion tenderness, no adnexal masses or tenderness.  Uterus difficult to palpate due to body habitus but feels normal.  Rectal:    Normal external sphincter.  No hemorrhoids appreciated. Internal exam not done.   Extremities:   Extremities normal, atraumatic, no cyanosis or edema  Pulses:   2+ and symmetric all extremities  Skin:   Skin color, texture, turgor normal, no rashes or lesions  Lymph nodes:   Cervical, supraclavicular, and axillary nodes  normal  Neurologic:   CNII-XII intact, normal strength, sensation and reflexes throughout     Labs:  Lab Results  Component Value Date   WBC 6.2 09/08/2016   HGB 11.7 09/08/2016   HCT 36.9 09/08/2016   MCV 77 (L) 09/08/2016   PLT 339 09/08/2016    Lab Results  Component Value Date   CREATININE 1.11 (H) 09/08/2016   BUN 13 09/08/2016   NA 140 09/08/2016   K 4.6 09/08/2016   CL 102 09/08/2016   CO2 21 09/08/2016    Lab Results  Component Value Date   ALT 17 07/20/2012   AST 17 07/20/2012   ALKPHOS 63 07/20/2012   BILITOT 0.4 07/20/2012    Lab Results  Component Value Date   TSH 1.700 09/08/2016     Assessment:   Healthy female exam.  Morbid obesity (Class III) H/o uterine fibroid H/o menorrhagia Contraception management  Plan:     Blood tests: Complete  metabolic panel, CBC with diff, Lipoproteins,and Vitamin D levels, glucose. Breast self exam technique reviewed and patient encouraged to perform self-exam monthly. Contraception: OCP (estrogen/progesterone). Refill given.  Discussed healthy lifestyle modifications. Morbid obesity - discussed need for dietary modification and exercise. Patient notes it is difficulty to exercise as she is working 2 jobs and is attending school. Discussed assistance with improving diet, will refer to nutritionist.  Pap smear due today. Performed.  STD screen performed at patient's request.  H/o uterine fibroids and menorrhagia controlled with OCPs.  Return to clinic in 1 year for annual exam, or sooner as needed    Rubie Maid, MD [facility]

## 2018-03-12 LAB — PAP IG, CT-NG TV RFX HPV ALL
Chlamydia, Nuc. Acid Amp: NEGATIVE
Gonococcus, Nuc. Acid Amp: NEGATIVE
PAP SMEAR COMMENT: 0
TRICH VAG BY NAA: NEGATIVE

## 2018-03-13 ENCOUNTER — Telehealth: Payer: Self-pay

## 2018-03-13 NOTE — Telephone Encounter (Signed)
Pt was called no answer LM to call the office for test results.

## 2018-03-13 NOTE — Telephone Encounter (Signed)
-----   Message from Rubie Maid, MD sent at 03/13/2018 10:57 AM EDT ----- Please inform patient that her pap smear was normal.  Remind her to come back sometime soon for her annual labs to be drawn fasting.

## 2018-03-14 ENCOUNTER — Other Ambulatory Visit: Payer: BC Managed Care – PPO

## 2018-03-14 ENCOUNTER — Other Ambulatory Visit: Payer: Self-pay | Admitting: Obstetrics and Gynecology

## 2018-03-15 LAB — LIPID PANEL
CHOL/HDL RATIO: 4.1 ratio (ref 0.0–4.4)
Cholesterol, Total: 152 mg/dL (ref 100–199)
HDL: 37 mg/dL — ABNORMAL LOW (ref >39)
LDL CALC: 101 mg/dL — AB (ref 0–99)
Triglycerides: 72 mg/dL (ref 0–149)
VLDL Cholesterol Cal: 14 mg/dL (ref 5–40)

## 2018-03-15 LAB — COMPREHENSIVE METABOLIC PANEL
ALBUMIN: 4 g/dL (ref 3.5–5.5)
ALT: 10 IU/L (ref 0–32)
AST: 16 IU/L (ref 0–40)
Albumin/Globulin Ratio: 1.1 — ABNORMAL LOW (ref 1.2–2.2)
Alkaline Phosphatase: 60 IU/L (ref 39–117)
BILIRUBIN TOTAL: 0.3 mg/dL (ref 0.0–1.2)
BUN/Creatinine Ratio: 8 — ABNORMAL LOW (ref 9–23)
BUN: 9 mg/dL (ref 6–20)
CALCIUM: 9.1 mg/dL (ref 8.7–10.2)
CHLORIDE: 103 mmol/L (ref 96–106)
CO2: 25 mmol/L (ref 20–29)
CREATININE: 1.13 mg/dL — AB (ref 0.57–1.00)
GFR, EST AFRICAN AMERICAN: 72 mL/min/{1.73_m2} (ref >59)
GFR, EST NON AFRICAN AMERICAN: 63 mL/min/{1.73_m2} (ref >59)
GLUCOSE: 89 mg/dL (ref 65–99)
Globulin, Total: 3.5 g/dL (ref 1.5–4.5)
Potassium: 4.4 mmol/L (ref 3.5–5.2)
Sodium: 138 mmol/L (ref 134–144)
TOTAL PROTEIN: 7.5 g/dL (ref 6.0–8.5)

## 2018-03-15 LAB — CBC
HEMATOCRIT: 34.3 % (ref 34.0–46.6)
HEMOGLOBIN: 10.8 g/dL — AB (ref 11.1–15.9)
MCH: 23.8 pg — AB (ref 26.6–33.0)
MCHC: 31.5 g/dL (ref 31.5–35.7)
MCV: 76 fL — AB (ref 79–97)
Platelets: 394 10*3/uL (ref 150–450)
RBC: 4.54 x10E6/uL (ref 3.77–5.28)
RDW: 15.7 % — AB (ref 12.3–15.4)
WBC: 5.9 10*3/uL (ref 3.4–10.8)

## 2018-03-15 LAB — RPR: RPR: NONREACTIVE

## 2018-03-15 LAB — HIV ANTIBODY (ROUTINE TESTING W REFLEX): HIV Screen 4th Generation wRfx: NONREACTIVE

## 2018-03-15 LAB — VITAMIN D 25 HYDROXY (VIT D DEFICIENCY, FRACTURES): Vit D, 25-Hydroxy: 20.3 ng/mL — ABNORMAL LOW (ref 30.0–100.0)

## 2018-06-25 ENCOUNTER — Encounter: Payer: BC Managed Care – PPO | Attending: Obstetrics and Gynecology | Admitting: Dietician

## 2018-06-25 ENCOUNTER — Other Ambulatory Visit: Payer: Self-pay

## 2018-06-25 ENCOUNTER — Emergency Department
Admission: EM | Admit: 2018-06-25 | Discharge: 2018-06-25 | Disposition: A | Discharge status: Home / Self Care | Payer: BC Managed Care – PPO | Attending: Emergency Medicine | Admitting: Emergency Medicine

## 2018-06-25 ENCOUNTER — Encounter: Payer: Self-pay | Admitting: Dietician

## 2018-06-25 ENCOUNTER — Emergency Department: Payer: BC Managed Care – PPO

## 2018-06-25 VITALS — Ht 65.0 in | Wt 311.6 lb

## 2018-06-25 DIAGNOSIS — Z6841 Body Mass Index (BMI) 40.0 and over, adult: Secondary | ICD-10-CM

## 2018-06-25 DIAGNOSIS — M79605 Pain in left leg: Secondary | ICD-10-CM | POA: Diagnosis present

## 2018-06-25 DIAGNOSIS — R252 Cramp and spasm: Secondary | ICD-10-CM | POA: Diagnosis not present

## 2018-06-25 DIAGNOSIS — M7918 Myalgia, other site: Secondary | ICD-10-CM

## 2018-06-25 LAB — BASIC METABOLIC PANEL
Anion gap: 5 (ref 5–15)
BUN: 13 mg/dL (ref 6–20)
CALCIUM: 8.7 mg/dL — AB (ref 8.9–10.3)
CHLORIDE: 108 mmol/L (ref 98–111)
CO2: 24 mmol/L (ref 22–32)
Creatinine, Ser: 1.02 mg/dL — ABNORMAL HIGH (ref 0.44–1.00)
GFR calc Af Amer: >60 mL/min (ref >60)
GFR calc non Af Amer: >60 mL/min (ref >60)
GLUCOSE: 94 mg/dL (ref 70–99)
Potassium: 4.1 mmol/L (ref 3.5–5.1)
Sodium: 137 mmol/L (ref 135–145)

## 2018-06-25 LAB — CBC
HEMATOCRIT: 32.9 % — AB (ref 35.0–47.0)
Hemoglobin: 10.6 g/dL — ABNORMAL LOW (ref 12.0–16.0)
MCH: 23.4 pg — ABNORMAL LOW (ref 26.0–34.0)
MCHC: 32.2 g/dL (ref 32.0–36.0)
MCV: 72.5 fL — AB (ref 80.0–100.0)
Platelets: 390 10*3/uL (ref 150–440)
RBC: 4.54 MIL/uL (ref 3.80–5.20)
RDW: 16.1 % — AB (ref 11.5–14.5)
WBC: 9.1 10*3/uL (ref 3.6–11.0)

## 2018-06-25 LAB — BRAIN NATRIURETIC PEPTIDE: B Natriuretic Peptide: 18 pg/mL (ref 0.0–100.0)

## 2018-06-25 LAB — CK: Total CK: 162 U/L (ref 38–234)

## 2018-06-25 NOTE — ED Notes (Signed)
Pt left after speaking to EDP before VS could be rechecked. Left without discharge paperwork.

## 2018-06-25 NOTE — ED Provider Notes (Signed)
[facility] Emergency Department Provider Note  Time seen: 12:40 PM  I have reviewed the triage vital signs and the nursing notes.   HISTORY  Chief Complaint Leg Pain (cramping )    HPI Shelley Lee is a 37 y.o. female with a past medical history of menorrhagia presents to the emergency department for left leg discomfort.  According to the patient of the past 2 to 3 days she has been experiencing cramping type pain in her left lower extremity especially left calf.  States it is somewhat worse when she is up and walking on it.  Denies any swelling.  Denies any redness.  Denies any fever.  No significant chest pain or trouble breathing.  Patient states she was concerned because her sister passed away from a pulmonary embolism 4 years ago after complaining of leg pain for a week or 2.   Past Medical History:  Diagnosis Date  . Fibroid   . Menorrhagia with regular cycle 02/01/2016  . Uterine fibroid 02/18/2016    Patient Active Problem List   Diagnosis Date Noted  . Uterine fibroid 02/18/2016  . Menorrhagia with regular cycle 02/01/2016    Past Surgical History:  Procedure Laterality Date  . UTERINE FIBROID SURGERY  2013    Prior to Admission medications   Medication Sig Start Date End Date Taking? Authorizing Provider  levonorgestrel-ethinyl estradiol (SEASONALE,INTROVALE,JOLESSA) 0.15-0.03 MG tablet Take 1 tablet by mouth daily. 03/08/18   Rubie Maid, MD    No Known Allergies  Family History  Problem Relation Age of Onset  . Diabetes Mother   . Pulmonary embolism Sister   . Clotting disorder Brother   . Breast cancer Paternal Aunt     Social History Social History   Tobacco Use  . Smoking status: Never Smoker  . Smokeless tobacco: Never Used  Substance Use Topics  . Alcohol use: Yes    Comment: Once weekly   . Drug use: No    Review of Systems Constitutional: Negative for fever. Cardiovascular: Negative for chest  pain. Respiratory: Negative for shortness of breath. Gastrointestinal: Negative for abdominal pain Musculoskeletal: Left leg pain/cramping Skin: Negative for skin complaints  Neurological: Negative for headache All other ROS negative  ____________________________________________   PHYSICAL EXAM:  VITAL SIGNS: ED Triage Vitals [06/25/18 1109]  Enc Vitals Group     BP 98/73     Pulse Rate 94     Resp 18     Temp 98 F (36.7 C)     Temp Source Oral     SpO2 100 %     Weight      Height      Head Circumference      Peak Flow      Pain Score 2     Pain Loc      Pain Edu?      Excl. in [place]?    Constitutional: Alert and oriented. Well appearing and in no distress. Eyes: Normal exam ENT   Head: Normocephalic and atraumatic.   Mouth/Throat: Mucous membranes are moist. Cardiovascular: Normal rate, regular rhythm. No murmur Respiratory: Normal respiratory effort without tachypnea nor retractions. Breath sounds are clear Gastrointestinal: Soft and nontender. No distention.   Musculoskeletal: No appreciable edema.  Patient does state mild tenderness to calf palpation in the left lower extremity.  2+ DP pulse.  Warm, no erythema. Neurologic:  Normal speech and language. No gross focal neurologic deficits  Skin:  Skin is warm, dry and intact.  Psychiatric: Mood and affect are normal.  ____________________________________________    RADIOLOGY  Ultrasound negative for DVT.  ____________________________________________   INITIAL IMPRESSION / ASSESSMENT AND PLAN / ED COURSE  Pertinent labs & imaging results that were available during my care of the patient were reviewed by me and considered in my medical decision making (see chart for details).  Patient presents to the emergency department for left leg cramping over the past 2 to 3 days.  Overall the patient appears very well, obese but otherwise normal examination.  No pleuritic pain.  No edema.  Mild calf tenderness.   Differential would include muscular skeletal pain, muscle cramping, electrolyte abnormality, DVT.  Patient's labs are overall reassuring.  Will obtain an ultrasound to further evaluate.  Patient agreeable to plan of care.  Ultrasound negative for DVT.  Otherwise reassuring examination.  Suspect musculoskeletal discomfort.  Patient will follow-up with her doctor.  ____________________________________________   FINAL CLINICAL IMPRESSION(S) / ED DIAGNOSES  Leg cramping    Harvest Dark, MD 06/25/18 1422

## 2018-06-25 NOTE — ED Triage Notes (Signed)
Pt states leg cramping x 2 days. Woke up with cramping Sunday morning. Alert, oriented. Ambulatory. Denies leg swelling or redness.

## 2018-06-25 NOTE — Patient Instructions (Signed)
   Start some meal prep for healthy, balanced meals.   Try adding a protein source, such as [place] yogurt, tofu, or protein powder into smoothies.  Track food intake as often as possible to stay aware of calorie intake and help manage food choices and portions.

## 2018-06-25 NOTE — Progress Notes (Signed)
Medical Nutrition Therapy: Visit start time: 0900  end time: 1015  Assessment:  Diagnosis: obesity Past medical history: none significant Psychosocial issues/ stress concerns: none  Preferred learning method:  . Auditory . Visual   Current weight: 311.6lbs Height: 5'5" Medications, supplements: reconciled list in medical record; currently taking birth control pills, no other meds.   Progress and evaluation: Patient reports gradual weight gain over past 7-8 years, 5-10lbs a year. Has tried a "smoothie challenge" diet, and other basic diet modifications with limited or short-term success only. She reports a lot of eating on the go now, inconsistent meals/ eating due to busy work schedule. Works 2 jobs, afternoon and night shifts. Patient feels she generally makes healthy choices when able. She sleeps about 4 hours in the am, then naps in evening before night shift.   Physical activity: no regular exercise; walks on the job during the night; walks dog in am.  Dietary Intake:  Usual eating pattern includes 3 meals and 3 snacks per day. Dining out frequency: 7 meals per week.  Breakfast: smoothie or boost drink, or eggs; biscuit 2x in past month Snack: popcorn, chips Lunch: often out-- sandwich Snack: popcorn, peanut butter crackers, chewy candy [place] Supper: rice, potatoes, Asian 4-5x a week; often chicken or shrimp mostly egg foo yong or hibachi meal Snack: peanut butter and apple, kettle corn, chips Beverages: water 50oz, energy drinks, not much coffee (2x a week)  Nutrition Care Education: Topics covered: weight control Basic nutrition: basic food groups, appropriate nutrient balance, appropriate meal and snack schedule, general nutrition guidelines    Weight control: importance of low fat and low sugar choices, portion control, plate method for best nutrient and calorie balance, benefits of tracking food intake, meal prep and healthy + quick meal options, role of exercise, non-food  factors including sleep, that can affect ability to lose weight.  Advanced nutrition: cooking techniques, meal prep   Nutritional Diagnosis:  [place]-3.3 Overweight/obesity As related to excess calories, inactivity.  As evidenced by patient with BMI of 51.8, and patient report of dietary history.  Intervention: Instruction as noted above.   Set goals with direction from patient; she would like to reduce restaurant meals and have more healthy meals.    Inadequate sleep could be factor in ability for patient to lose weight.   Education Materials given:  . Plate Planner with food lists . Meal Prep handout . Snacking handout . Goals/ instructions   Learner/ who was taught:  . Patient   Level of understanding: Marland Kitchen Verbalizes/ demonstrates competency   Demonstrated degree of understanding via:   Teach back Learning barriers: . None  Willingness to learn/ readiness for change: . Acceptance, ready for change   Monitoring and Evaluation:  Dietary intake, exercise, and body weight      follow up: 08/01/18

## 2018-07-18 ENCOUNTER — Encounter: Payer: Self-pay | Admitting: Family Medicine

## 2018-07-18 ENCOUNTER — Ambulatory Visit: Payer: BC Managed Care – PPO | Admitting: Family Medicine

## 2018-07-18 VITALS — BP 122/72 | HR 107 | Temp 98.7°F | Ht 64.5 in | Wt 308.6 lb

## 2018-07-18 DIAGNOSIS — Z23 Encounter for immunization: Secondary | ICD-10-CM

## 2018-07-18 DIAGNOSIS — L989 Disorder of the skin and subcutaneous tissue, unspecified: Secondary | ICD-10-CM

## 2018-07-18 NOTE — Progress Notes (Signed)
Subjective:    Patient ID: Shelley Lee, female    DOB: 1981-09-10, 36 y.o.   MRN: [id number]  HPI   Patient presents to clinic to establish primary care with new PCP.  Patient's medical history, social history, surgical history, family history reviewed and updated in chart.  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  Patient's Pap smear and lab work is up-to-date, done in July 2019.  Patient would like flu vaccine today.  Patient takes birth control to help control vaginal bleeding that is related to uterine fibroids, follows with GYN.  Patient has a bruise/abnormal looking area of skin on left breast and would like this looked at.  States she noticed the area approximately 1 month ago.  Patient Active Problem List   Diagnosis Date Noted  . Uterine fibroid 02/18/2016  . Menorrhagia with regular cycle 02/01/2016   Social History   Tobacco Use  . Smoking status: Never Smoker  . Smokeless tobacco: Never Used  Substance Use Topics  . Alcohol use: Yes    Comment: Once weekly    Past Surgical History:  Procedure Laterality Date  . UTERINE FIBROID SURGERY  2013   Past Medical History:  Diagnosis Date  . Fibroid   . Hyperlipidemia   . Menorrhagia with regular cycle 02/01/2016  . Uterine fibroid 02/18/2016   Review of Systems   Constitutional: Negative for chills, fatigue and fever.  HENT: Negative for congestion, ear pain, sinus pain and sore throat.   Eyes: Negative.   Respiratory: Negative for cough, shortness of breath and wheezing.   Cardiovascular: Negative for chest pain, palpitations and leg swelling.  Gastrointestinal: Negative for abdominal pain, diarrhea, nausea and vomiting.  Genitourinary: Negative for dysuria, frequency and urgency.  Musculoskeletal: Negative for arthralgias and myalgias.  Skin: bruise or swollen area on right breast Neurological: Negative  for syncope, light-headedness and headaches.  Psychiatric/Behavioral: The patient is not nervous/anxious.       Objective:   Physical Exam  Constitutional: She is oriented to person, place, and time. She appears well-developed and well-nourished. No distress.  Eyes: EOM are normal. No scleral icterus.  Cardiovascular: Normal rate and regular rhythm.  Pulmonary/Chest: Effort normal and breath sounds normal. No respiratory distress. Right breast exhibits no inverted nipple, no mass, no nipple discharge and no tenderness. Left breast exhibits no inverted nipple, no mass, no nipple discharge and no tenderness.  Small darker area of skin on chest wall, near left breast (wear underwire of bra would rub), looks like clogged pore of skin.     Musculoskeletal: She exhibits no edema.  Neurological: She is alert and oriented to person, place, and time.  Skin: Skin is warm and dry. No pallor.  Psychiatric: She has a normal mood and affect. Her behavior is normal. Thought content normal.  Nursing note and vitals reviewed.     Vitals:   07/18/18 1509  BP: 122/72  Pulse: (!) 107  Temp: 98.7 F (37.1 C)  SpO2: 98%    Assessment & Plan:   Skin abnormality - area of darker skin Left breast appears like a clogged pore.  Area is directly where the underwire bra would rub & where skin becomes sweaty. Advised to try wearing a sports bra or a bra without underwire to see if this helps.  Improved.  Also suggested she note wear a bra when  sleeping to allow skin time to air out. Cleanse skin with mild soap & water, be sure to dry skin well.   Flu vaccine given in clinic today.

## 2018-07-19 ENCOUNTER — Encounter: Payer: Self-pay | Admitting: Family Medicine

## 2018-08-01 ENCOUNTER — Ambulatory Visit: Payer: BC Managed Care – PPO | Admitting: Dietician

## 2018-08-08 ENCOUNTER — Ambulatory Visit: Payer: BC Managed Care – PPO | Admitting: Dietician

## 2018-08-26 ENCOUNTER — Encounter: Payer: Self-pay | Admitting: Dietician

## 2018-08-26 NOTE — Progress Notes (Signed)
Have not heard from patient to reschedule her second missed appointment from 08/08/18. Sent discharge letter to referring provider.

## 2019-03-25 ENCOUNTER — Telehealth: Payer: Self-pay

## 2019-03-25 NOTE — Telephone Encounter (Signed)
Pt called no answer LM to call the office to do prescreening.

## 2019-03-25 NOTE — Progress Notes (Deleted)
PT is present today for her annual exam. Pt stated that she has been doing self-breast exams monthly. Pt stated that she is doing well and denies any issues. No problems or concerns.     

## 2019-03-26 ENCOUNTER — Encounter: Payer: BC Managed Care – PPO | Admitting: Obstetrics and Gynecology

## 2019-05-13 NOTE — Progress Notes (Signed)
Pt is present for annual exam. Pt stated that she doing well no problems. Pt requested STD screening.

## 2019-05-13 NOTE — Patient Instructions (Signed)
Preventive Care 20-37 Years Old, Female Preventive care refers to visits with your health care provider and lifestyle choices that can promote health and wellness. This includes:  A yearly physical exam. This may also be called an annual well check.  Regular dental visits and eye exams.  Immunizations.  Screening for certain conditions.  Healthy lifestyle choices, such as eating a healthy diet, getting regular exercise, not using drugs or products that contain nicotine and tobacco, and limiting alcohol use. What can I expect for my preventive care visit? Physical exam Your health care provider will check your:  Height and weight. This may be used to calculate body mass index (BMI), which tells if you are at a healthy weight.  Heart rate and blood pressure.  Skin for abnormal spots. Counseling Your health care provider may ask you questions about your:  Alcohol, tobacco, and drug use.  Emotional well-being.  Home and relationship well-being.  Sexual activity.  Eating habits.  Work and work Statistician.  Method of birth control.  Menstrual cycle.  Pregnancy history. What immunizations do I need?  Influenza (flu) vaccine  This is recommended every year. Tetanus, diphtheria, and pertussis (Tdap) vaccine  You may need a Td booster every 10 years. Varicella (chickenpox) vaccine  You may need this if you have not been vaccinated. Human papillomavirus (HPV) vaccine  If recommended by your health care provider, you may need three doses over 6 months. Measles, mumps, and rubella (MMR) vaccine  You may need at least one dose of MMR. You may also need a second dose. Meningococcal conjugate (MenACWY) vaccine  One dose is recommended if you are age 75-21 years and a first-year college student living in a residence hall, or if you have one of several medical conditions. You may also need additional booster doses. Pneumococcal conjugate (PCV13) vaccine  You may need  this if you have certain conditions and were not previously vaccinated. Pneumococcal polysaccharide (PPSV23) vaccine  You may need one or two doses if you smoke cigarettes or if you have certain conditions. Hepatitis A vaccine  You may need this if you have certain conditions or if you travel or work in places where you may be exposed to hepatitis A. Hepatitis B vaccine  You may need this if you have certain conditions or if you travel or work in places where you may be exposed to hepatitis B. Haemophilus influenzae type b (Hib) vaccine  You may need this if you have certain conditions. You may receive vaccines as individual doses or as more than one vaccine together in one shot (combination vaccines). Talk with your health care provider about the risks and benefits of combination vaccines. What tests do I need?  Blood tests  Lipid and cholesterol levels. These may be checked every 5 years starting at age 33.  Hepatitis C test.  Hepatitis B test. Screening  Diabetes screening. This is done by checking your blood sugar (glucose) after you have not eaten for a while (fasting).  Sexually transmitted disease (STD) testing.  BRCA-related cancer screening. This may be done if you have a family history of breast, ovarian, tubal, or peritoneal cancers.  Pelvic exam and Pap test. This may be done every 3 years starting at age 76. Starting at age 102, this may be done every 5 years if you have a Pap test in combination with an HPV test. Talk with your health care provider about your test results, treatment options, and if necessary, the need for more tests.  Follow these instructions at home: °Eating and drinking ° °· Eat a diet that includes fresh fruits and vegetables, whole grains, lean protein, and low-fat dairy. °· Take vitamin and mineral supplements as recommended by your health care provider. °· Do not drink alcohol if: °? Your health care provider tells you not to drink. °? You are  pregnant, may be pregnant, or are planning to become pregnant. °· If you drink alcohol: °? Limit how much you have to 0-1 drink a day. °? Be aware of how much alcohol is in your drink. In the [place] one drink equals one 12 oz bottle of beer (355 mL), one 5 oz glass of wine (148 mL), or one 1½ oz glass of hard liquor (44 mL). °Lifestyle °· Take daily care of your teeth and gums. °· Stay active. Exercise for at least 30 minutes on 5 or more days each week. °· Do not use any products that contain nicotine or tobacco, such as cigarettes, e-cigarettes, and chewing tobacco. If you need help quitting, ask your health care provider. °· If you are sexually active, practice safe sex. Use a condom or other form of birth control (contraception) in order to prevent pregnancy and STIs (sexually transmitted infections). If you plan to become pregnant, see your health care provider for a preconception visit. °What's next? °· Visit your health care provider once a year for a well check visit. °· Ask your health care provider how often you should have your eyes and teeth checked. °· Stay up to date on all vaccines. °This information is not intended to replace advice given to you by your health care provider. Make sure you discuss any questions you have with your health care provider. °Document Released: 12/05/2001 Document Revised: 06/20/2018 Document Reviewed: 06/20/2018 °Elsevier Patient Education © 2020 [employer] °Breast Self-Awareness °Breast self-awareness is knowing how your breasts look and feel. Doing breast self-awareness is important. It allows you to catch a breast problem early while it is still small and can be treated. All women should do breast self-awareness, including women who have had breast implants. Tell your doctor if you notice a change in your breasts. °What you need: °· A mirror. °· A well-lit room. °How to do a breast self-exam °A breast self-exam is one way to learn what is normal for your breasts and to  check for changes. To do a breast self-exam: °Look for changes ° °1. Take off all the clothes above your waist. °2. Stand in front of a mirror in a room with good lighting. °3. Put your hands on your hips. °4. Push your hands down. °5. Look at your breasts and nipples in the mirror to see if one breast or nipple looks different from the other. Check to see if: °? The shape of one breast is different. °? The size of one breast is different. °? There are wrinkles, dips, and bumps in one breast and not the other. °6. Look at each breast for changes in the skin, such as: °? Redness. °? Scaly areas. °7. Look for changes in your nipples, such as: °? Liquid around the nipples. °? Bleeding. °? Dimpling. °? Redness. °? A change in where the nipples are. °Feel for changes ° °1. Lie on your back on the floor. °2. Feel each breast. To do this, follow these steps: °? Pick a breast to feel. °? Put the arm closest to that breast above your head. °? Use your other arm to feel the nipple area of   your breast. Feel the area with the pads of your three middle fingers by making small circles with your fingers. For the first circle, press lightly. For the second circle, press harder. For the third circle, press even harder. °? Keep making circles with your fingers at the different pressures as you move down your breast. Stop when you feel your ribs. °? Move your fingers a little toward the center of your body. °? Start making circles with your fingers again, this time going up until you reach your collarbone. °? Keep making up-and-down circles until you reach your armpit. Remember to keep using the three pressures. °? Feel the other breast in the same way. °3. Sit or stand in the tub or shower. °4. With soapy water on your skin, feel each breast the same way you did in step 2 when you were lying on the floor. °Write down what you find °Writing down what you find can help you remember what to tell your doctor. Write down: °· What is  normal for each breast. °· Any changes you find in each breast, including: °? The kind of changes you find. °? Whether you have pain. °? Size and location of any lumps. °· When you last had your menstrual period. °General tips °· Check your breasts every month. °· If you are breastfeeding, the best time to check your breasts is after you feed your baby or after you use a breast pump. °· If you get menstrual periods, the best time to check your breasts is 5-7 days after your menstrual period is over. °· With time, you will become comfortable with the self-exam, and you will begin to know if there are changes in your breasts. °Contact a doctor if you: °· See a change in the shape or size of your breasts or nipples. °· See a change in the skin of your breast or nipples, such as red or scaly skin. °· Have fluid coming from your nipples that is not normal. °· Find a lump or thick area that was not there before. °· Have pain in your breasts. °· Have any concerns about your breast health. °Summary °· Breast self-awareness includes looking for changes in your breasts, as well as feeling for changes within your breasts. °· Breast self-awareness should be done in front of a mirror in a well-lit room. °· You should check your breasts every month. If you get menstrual periods, the best time to check your breasts is 5-7 days after your menstrual period is over. °· Let your doctor know of any changes you see in your breasts, including changes in size, changes on the skin, pain or tenderness, or fluid from your nipples that is not normal. °This information is not intended to replace advice given to you by your health care provider. Make sure you discuss any questions you have with your health care provider. °Document Released: 03/27/2008 Document Revised: 05/28/2018 Document Reviewed: 05/28/2018 °Elsevier Patient Education © 2020 Else[employer] Inc[employer] ° °

## 2019-05-14 ENCOUNTER — Ambulatory Visit (INDEPENDENT_AMBULATORY_CARE_PROVIDER_SITE_OTHER): Payer: BC Managed Care – PPO | Admitting: Obstetrics and Gynecology

## 2019-05-14 ENCOUNTER — Encounter: Payer: Self-pay | Admitting: Obstetrics and Gynecology

## 2019-05-14 ENCOUNTER — Other Ambulatory Visit (HOSPITAL_COMMUNITY)
Admission: RE | Admit: 2019-05-14 | Discharge: 2019-05-14 | Disposition: A | Discharge status: Home / Self Care | Payer: BC Managed Care – PPO | Source: Ambulatory Visit | Attending: Obstetrics and Gynecology | Admitting: Obstetrics and Gynecology

## 2019-05-14 ENCOUNTER — Other Ambulatory Visit: Payer: Self-pay

## 2019-05-14 VITALS — BP 118/64 | HR 80 | Ht 64.5 in | Wt 300.7 lb

## 2019-05-14 DIAGNOSIS — Z01419 Encounter for gynecological examination (general) (routine) without abnormal findings: Secondary | ICD-10-CM | POA: Diagnosis not present

## 2019-05-14 DIAGNOSIS — Z13220 Encounter for screening for lipoid disorders: Secondary | ICD-10-CM | POA: Diagnosis not present

## 2019-05-14 DIAGNOSIS — Z113 Encounter for screening for infections with a predominantly sexual mode of transmission: Secondary | ICD-10-CM | POA: Insufficient documentation

## 2019-05-14 DIAGNOSIS — Z8639 Personal history of other endocrine, nutritional and metabolic disease: Secondary | ICD-10-CM

## 2019-05-14 DIAGNOSIS — Z124 Encounter for screening for malignant neoplasm of cervix: Secondary | ICD-10-CM

## 2019-05-14 DIAGNOSIS — Z131 Encounter for screening for diabetes mellitus: Secondary | ICD-10-CM | POA: Diagnosis not present

## 2019-05-14 NOTE — Progress Notes (Signed)
GYNECOLOGY ANNUAL PHYSICAL EXAM PROGRESS NOTE  Subjective:    Shelley Lee is a 38 y.o. G0P0 female who presents for an annual exam. The patient has no complaints today. The patient is not currently sexually active.The patient wears seatbelts: yes. The patient participates in regular exercise: no. Has the patient ever been transfused or tattooed?: no. The patient reports that there is not domestic violence in her life.    Gynecologic History Menarche age: 51 Patient's last menstrual period was 03/24/2019. Cycles every 4 months.  Contraception: OCP (estrogen/progesterone), continuous regimen History of STI's: Denies Last Pap: 03/08/2018. Results were: normal.  Denies h/o abnormal pap smears.   OB History  Gravida Para Term Preterm AB Living  0 0 0 0 0 0  SAB TAB Ectopic Multiple Live Births  0 0 0 0 0    Past Medical History:  Diagnosis Date  . Fibroid   . Hyperlipidemia   . Menorrhagia with regular cycle 02/01/2016  . Uterine fibroid 02/18/2016    Past Surgical History:  Procedure Laterality Date  . UTERINE FIBROID SURGERY  2013    Family History  Problem Relation Age of Onset  . Hypertension Mother   . Healthy Father   . Pulmonary embolism Sister   . Clotting disorder Brother   . Breast cancer Paternal Aunt   . Early death Maternal Grandmother   . Stroke Maternal Grandmother   . Hypertension Maternal Grandfather   . Cancer Paternal Grandfather   . Heart disease Paternal Grandfather     Social History   Socioeconomic History  . Marital status: Single    Spouse name: Not on file  . Number of children: Not on file  . Years of education: Not on file  . Highest education level: Not on file  Occupational History  . Not on file  Social Needs  . Financial resource strain: Not on file  . Food insecurity    Worry: Not on file    Inability: Not on file  . Transportation needs    Medical: Not on file    Non-medical: Not on file  Tobacco Use  . Smoking  status: Never Smoker  . Smokeless tobacco: Never Used  Substance and Sexual Activity  . Alcohol use: Yes    Comment: Once weekly   . Drug use: No  . Sexual activity: Not Currently    Birth control/protection: Pill  Lifestyle  . Physical activity    Days per week: Not on file    Minutes per session: Not on file  . Stress: Not on file  Relationships  . Social Herbalist on phone: Not on file    Gets together: Not on file    Attends religious service: Not on file    Active member of club or organization: Not on file    Attends meetings of clubs or organizations: Not on file    Relationship status: Not on file  . Intimate partner violence    Fear of current or ex partner: Not on file    Emotionally abused: Not on file    Physically abused: Not on file    Forced sexual activity: Not on file  Other Topics Concern  . Not on file  Social History Narrative  . Not on file    Current Outpatient Medications on File Prior to Visit  Medication Sig Dispense Refill  . levonorgestrel-ethinyl estradiol (SEASONALE,INTROVALE,JOLESSA) 0.15-0.03 MG tablet Take 1 tablet by mouth daily. 91 tablet 3  No current facility-administered medications on file prior to visit.     No Known Allergies   Review of Systems Constitutional: negative for chills, fatigue, fevers and sweats Eyes: negative for irritation, redness and visual disturbance Ears, nose, mouth, throat, and face: negative for hearing loss, nasal congestion, snoring and tinnitus Respiratory: negative for asthma, cough, sputum Cardiovascular: negative for chest pain, dyspnea, exertional chest pressure/discomfort, irregular heart beat, palpitations and syncope Gastrointestinal: negative for abdominal pain, change in bowel habits, nausea and vomiting Genitourinary: negative for abnormal menstrual periods, genital lesions, sexual problems and vaginal discharge, dysuria and urinary incontinence Integument/breast: negative for  breast lump, breast tenderness and nipple discharge Hematologic/lymphatic: negative for bleeding and easy bruising Musculoskeletal:negative for back pain and muscle weakness Neurological: negative for dizziness, headaches, vertigo and weakness Endocrine: negative for diabetic symptoms including polydipsia, polyuria and skin dryness Allergic/Immunologic: negative for hay fever and urticaria        Objective:  Blood pressure 118/64, pulse 80, height 5' 4.5" (1.638 m), weight (!) 300 lb 11.2 oz (136.4 kg), last menstrual period 03/24/2019. Body mass index is 50.82 kg/m.  General Appearance:    Alert, cooperative, no distress, appears stated age, morbidly obese.   Head:    Normocephalic, without obvious abnormality, atraumatic  Eyes:    PERRL, conjunctiva/corneas clear, EOM's intact, both eyes  Ears:    Normal external ear canals, both ears  Nose:   Nares normal, septum midline, mucosa normal, no drainage or sinus tenderness  Throat:   Lips, mucosa, and tongue normal; teeth and gums normal  Neck:   Supple, symmetrical, trachea midline, no adenopathy; thyroid: no enlargement/tenderness/nodules; no carotid bruit or JVD  Back:     Symmetric, no curvature, ROM normal, no CVA tenderness  Lungs:     Clear to auscultation bilaterally, respirations unlabored  Chest Wall:    No tenderness or deformity   Heart:    Regular rate and rhythm, S1 and S2 normal, no murmur, rub or gallop  Breast Exam:    No tenderness, masses, or nipple abnormality  Abdomen:     Soft, non-tender, bowel sounds active all four quadrants, no masses, no organomegaly. Large pannus.    Genitalia:    Pelvic:external genitalia normal, vagina without lesions. Scant thick white discharge, no odor.  No tenderness, rectovaginal septum  normal. Cervix normal in appearance, no cervical motion tenderness, no adnexal masses or tenderness.  Uterus difficult to palpate due to body habitus but feels normal.  Rectal:    Normal external sphincter.   No hemorrhoids appreciated. Internal exam not done.   Extremities:   Extremities normal, atraumatic, no cyanosis or edema  Pulses:   2+ and symmetric all extremities  Skin:   Skin color, texture, turgor normal, no rashes or lesions  Lymph nodes:   Cervical, supraclavicular, and axillary nodes normal  Neurologic:   CNII-XII intact, normal strength, sensation and reflexes throughout     Labs:  Lab Results  Component Value Date   WBC 9.1 06/25/2018   HGB 10.6 (L) 06/25/2018   HCT 32.9 (L) 06/25/2018   MCV 72.5 (L) 06/25/2018   PLT 390 06/25/2018    Lab Results  Component Value Date   CREATININE 1.02 (H) 06/25/2018   BUN 13 06/25/2018   NA 137 06/25/2018   K 4.1 06/25/2018   CL 108 06/25/2018   CO2 24 06/25/2018    Lab Results  Component Value Date   ALT 10 03/14/2018   AST 16 03/14/2018   ALKPHOS 60  03/14/2018   BILITOT 0.3 03/14/2018    Lab Results  Component Value Date   TSH 1.700 09/08/2016     Assessment:   Healthy female exam.  Morbid obesity (Class III) H/o uterine fibroid H/o menorrhagia Contraception management  Plan:     Blood tests: Complete metabolic panel, CBC with diff, Lipoproteins,and Vitamin D levels, glucose. Breast self exam technique reviewed and patient encouraged to perform self-exam monthly. Contraception: OCP (estrogen/progesterone). Refill given.  Discussed healthy lifestyle modifications. Morbid obesity - discussed need for dietary modification and exercise. Patient notes it is difficulty to exercise as she is working 2 jobs and is attending school. Discussed assistance with improving diet, will refer to nutritionist.  Pap smear up to date.  STD screen performed at patient's request.  H/o uterine fibroids and menorrhagia controlled with OCPs.  Return to clinic in 1 year for annual exam, or sooner as needed    Rubie Maid, MD [facility]

## 2019-05-14 NOTE — Progress Notes (Signed)
GYNECOLOGY ANNUAL PHYSICAL EXAM PROGRESS NOTE  Subjective:    Shelley Lee is a 38 y.o. G0P0 female who presents for an annual exam. The patient has no new complaints today. The patient wears seatbelts: yes. The patient participates in regular exercise: yes. Has the patient ever been transfused or tattooed?: no. The patient reports that there is not domestic violence in her life.   -She is still taking the Seasonale OCP and liking this for birth control. Her periods are okay on this- some irregular spotting, but fibroids aren't too painful. She does admit that she sometimes forgets to take a few pills, which could explain this. Has periods every 4 months.  -Pt requests STD testing at today's visit. Denies STD symptoms. Sexually active in the last year, but no new partners since last screening. -She has been working on healthy diet/exercise, intentional weight loss of 12 pounds in the last 2 months. Getting 60 minutes of exercise 5 days a week. She's had more time to exercise due to self quarantining.  -She has a PCP, [facility], Jacquelynn Cree, FNP, but we do her labs  Gynecologic History Menarche age: 58 Patient's last menstrual period was 03/24/2019. Cycles every 4 months.  Contraception: OCP (estrogen/progesterone), continuous regimen History of STI's: Denies Last Pap: 02/2018. Results were: normal.  Denies h/o abnormal pap smears.   OB History  Gravida Para Term Preterm AB Living  0 0 0 0 0 0  SAB TAB Ectopic Multiple Live Births  0 0 0 0 0    Past Medical History:  Diagnosis Date  . Fibroid   . Hyperlipidemia   . Menorrhagia with regular cycle 02/01/2016  . Uterine fibroid 02/18/2016    Past Surgical History:  Procedure Laterality Date  . UTERINE FIBROID SURGERY  2013    Family History  Problem Relation Age of Onset  . Hypertension Mother   . Healthy Father   . Pulmonary embolism Sister   . Clotting disorder Brother   . Breast cancer Paternal Aunt   . Early death Maternal  Grandmother   . Stroke Maternal Grandmother   . Hypertension Maternal Grandfather   . Cancer Paternal Grandfather   . Heart disease Paternal Grandfather     Social History   Socioeconomic History  . Marital status: Single    Spouse name: Not on file  . Number of children: Not on file  . Years of education: Not on file  . Highest education level: Not on file  Occupational History  . Not on file  Social Needs  . Financial resource strain: Not on file  . Food insecurity    Worry: Not on file    Inability: Not on file  . Transportation needs    Medical: Not on file    Non-medical: Not on file  Tobacco Use  . Smoking status: Never Smoker  . Smokeless tobacco: Never Used  Substance and Sexual Activity  . Alcohol use: Yes    Comment: Once weekly   . Drug use: No  . Sexual activity: Not Currently    Birth control/protection: Pill  Lifestyle  . Physical activity    Days per week: Not on file    Minutes per session: Not on file  . Stress: Not on file  Relationships  . Social Herbalist on phone: Not on file    Gets together: Not on file    Attends religious service: Not on file    Active member of club  or organization: Not on file    Attends meetings of clubs or organizations: Not on file    Relationship status: Not on file  . Intimate partner violence    Fear of current or ex partner: Not on file    Emotionally abused: Not on file    Physically abused: Not on file    Forced sexual activity: Not on file  Other Topics Concern  . Not on file  Social History Narrative  . Not on file    Current Outpatient Medications on File Prior to Visit  Medication Sig Dispense Refill  . levonorgestrel-ethinyl estradiol (SEASONALE,INTROVALE,JOLESSA) 0.15-0.03 MG tablet Take 1 tablet by mouth daily. 91 tablet 3   No current facility-administered medications on file prior to visit.     No Known Allergies   Review of Systems Constitutional: negative for chills,  fatigue, fevers and sweats. Intentional weight loss Eyes: negative for irritation, redness and visual disturbance Ears, nose, mouth, throat, and face: negative for hearing loss, nasal congestion, snoring and tinnitus Respiratory: negative for asthma, cough, sputum Cardiovascular: negative for chest pain, dyspnea, exertional chest pressure/discomfort, irregular heart beat, palpitations and syncope Gastrointestinal: negative for abdominal pain, change in bowel habits, nausea and vomiting Genitourinary: negative for abnormal menstrual periods, genital lesions, sexual problems and vaginal discharge, dysuria and urinary incontinence. Periods every 4 months on OCP Integument/breast: negative for breast lump, breast tenderness and nipple discharge Hematologic/lymphatic: negative for bleeding and easy bruising Musculoskeletal:negative for back pain and muscle weakness Neurological: negative for dizziness, headaches, vertigo and weakness Endocrine: negative for diabetic symptoms including polydipsia, polyuria and skin dryness Allergic/Immunologic: negative for hay fever and urticaria        Objective:  Blood pressure 118/64, pulse 80, height 5' 4.5" (1.638 m), weight (!) 136.4 kg, last menstrual period 03/24/2019. Body mass index is 50.82 kg/m.  General Appearance:    Alert, cooperative, no distress, appears stated age, morbidly obese, intentional weight loss.   Head:    Normocephalic, without obvious abnormality, atraumatic  Eyes:    PERRL, conjunctiva/corneas clear, EOM's intact, both eyes  Ears:    Normal external ear canals, both ears  Nose:   Nares normal, septum midline, mucosa normal, no drainage or sinus tenderness  Throat:   Lips, mucosa, and tongue normal; teeth and gums normal  Neck:   Supple, symmetrical, trachea midline, no adenopathy; thyroid: no enlargement/tenderness/nodules; no carotid bruit or JVD  Back:     Symmetric, no curvature, ROM normal, no CVA tenderness  Lungs:     Clear  to auscultation bilaterally, respirations unlabored  Chest Wall:    No tenderness or deformity   Heart:    Regular rate and rhythm, S1 and S2 normal, no murmur, rub or gallop  Breast Exam:    No tenderness, masses, or nipple abnormality  Abdomen:     Soft, non-tender, bowel sounds active all four quadrants, no masses, no organomegaly.   Genitalia:    Pelvic:external genitalia normal, vagina without lesions. Scant bloody discharge. No tenderness, rectovaginal septum  normal. Cervix normal in appearance, no cervical motion tenderness, no adnexal masses or tenderness.  Uterus difficult to palpate due to body habitus but feels normal.   Rectal:    Normal external sphincter.  No hemorrhoids appreciated. Internal exam not done.   Extremities:   Extremities normal, atraumatic, no cyanosis or edema  Pulses:   2+ and symmetric all extremities  Skin:   Skin color, texture, turgor normal, no rashes or lesions  Lymph nodes:  Cervical, supraclavicular, and axillary nodes normal  Neurologic:   CNII-XII intact, normal strength, sensation and reflexes throughout     Labs:  Lab Results  Component Value Date   WBC 9.1 06/25/2018   HGB 10.6 (L) 06/25/2018   HCT 32.9 (L) 06/25/2018   MCV 72.5 (L) 06/25/2018   PLT 390 06/25/2018    Lab Results  Component Value Date   CREATININE 1.02 (H) 06/25/2018   BUN 13 06/25/2018   NA 137 06/25/2018   K 4.1 06/25/2018   CL 108 06/25/2018   CO2 24 06/25/2018    Lab Results  Component Value Date   ALT 10 03/14/2018   AST 16 03/14/2018   ALKPHOS 60 03/14/2018   BILITOT 0.3 03/14/2018    Lab Results  Component Value Date   TSH 1.700 09/08/2016     Assessment:   Healthy female exam.  Morbid obesity (Class III) H/o uterine fibroid H/o menorrhagia Contraception management  Problem List Items Addressed This Visit    None    Visit Diagnoses    Well woman exam with routine gynecological exam    -  Primary   Relevant Orders   CBC with  Differential/Platelet   Basic Metabolic Panel (BMET)   Screening for diabetes mellitus       Relevant Orders   Hemoglobin A1c   Screening for lipoid disorders       Relevant Orders   Lipid panel   Morbid obesity ([place])       Screen for STD (sexually transmitted disease)       Relevant Orders   HIV Antibody (routine testing w rflx)   RPR   Hepatitis C antibody   Cervicovaginal ancillary only   Screening for malignant neoplasm of cervix       History of vitamin D deficiency       Relevant Orders   VITAMIN D 25 Hydroxy (Vit-D Deficiency, Fractures)       Plan:     Blood tests: as above- CBC with diff, BMP, A1c, Lipid panel, STD screen (HIV antibody, RPR, Hep C antibody), vitamin D. Nonfasting labs (Pt ate yogurt 3 hours ago, but nothing else.)  Breast self exam technique reviewed and patient encouraged to perform self-exam monthly. Contraception: OCP (estrogen/progesterone). Refill given.  Discussed healthy lifestyle modifications. Praised weight loss of 12 pounds in the last 2 months. Continue healthy lifestyle. STD screen performed at patient's request. No STD symptoms/risks. H/o uterine fibroids and menorrhagia controlled with OCPs.  Return to clinic in 1 year for annual exam, or sooner as needed    Marinus Maw [facility]

## 2019-05-15 LAB — CBC WITH DIFFERENTIAL/PLATELET
Basophils Absolute: 0.1 10*3/uL (ref 0.0–0.2)
Basos: 1 %
EOS (ABSOLUTE): 0.1 10*3/uL (ref 0.0–0.4)
Eos: 2 %
Hematocrit: 38.2 % (ref 34.0–46.6)
Hemoglobin: 12.2 g/dL (ref 11.1–15.9)
Immature Grans (Abs): 0 10*3/uL (ref 0.0–0.1)
Immature Granulocytes: 0 %
Lymphocytes Absolute: 1.8 10*3/uL (ref 0.7–3.1)
Lymphs: 23 %
MCH: 26.8 pg (ref 26.6–33.0)
MCHC: 31.9 g/dL (ref 31.5–35.7)
MCV: 84 fL (ref 79–97)
Monocytes Absolute: 0.4 10*3/uL (ref 0.1–0.9)
Monocytes: 5 %
Neutrophils Absolute: 5.5 10*3/uL (ref 1.4–7.0)
Neutrophils: 69 %
Platelets: 404 10*3/uL (ref 150–450)
RBC: 4.56 x10E6/uL (ref 3.77–5.28)
RDW: 13 % (ref 11.7–15.4)
WBC: 7.8 10*3/uL (ref 3.4–10.8)

## 2019-05-15 LAB — VITAMIN D 25 HYDROXY (VIT D DEFICIENCY, FRACTURES): Vit D, 25-Hydroxy: 24.1 ng/mL — ABNORMAL LOW (ref 30.0–100.0)

## 2019-05-15 LAB — BASIC METABOLIC PANEL
BUN/Creatinine Ratio: 9 (ref 9–23)
BUN: 10 mg/dL (ref 6–20)
CO2: 22 mmol/L (ref 20–29)
Calcium: 9.3 mg/dL (ref 8.7–10.2)
Chloride: 102 mmol/L (ref 96–106)
Creatinine, Ser: 1.13 mg/dL — ABNORMAL HIGH (ref 0.57–1.00)
GFR calc Af Amer: 72 mL/min/{1.73_m2} (ref >59)
GFR calc non Af Amer: 62 mL/min/{1.73_m2} (ref >59)
Glucose: 82 mg/dL (ref 65–99)
Potassium: 4.6 mmol/L (ref 3.5–5.2)
Sodium: 137 mmol/L (ref 134–144)

## 2019-05-15 LAB — LIPID PANEL
Chol/HDL Ratio: 4.2 ratio (ref 0.0–4.4)
Cholesterol, Total: 159 mg/dL (ref 100–199)
HDL: 38 mg/dL — ABNORMAL LOW (ref >39)
LDL Calculated: 108 mg/dL — ABNORMAL HIGH (ref 0–99)
Triglycerides: 66 mg/dL (ref 0–149)
VLDL Cholesterol Cal: 13 mg/dL (ref 5–40)

## 2019-05-15 LAB — HEMOGLOBIN A1C
Est. average glucose Bld gHb Est-mCnc: 103 mg/dL
Hgb A1c MFr Bld: 5.2 % (ref 4.8–5.6)

## 2019-05-15 LAB — HEPATITIS C ANTIBODY: Hep C Virus Ab: <0.1 s/co ratio (ref 0.0–0.9)

## 2019-05-15 LAB — CERVICOVAGINAL ANCILLARY ONLY
Chlamydia: NEGATIVE
Neisseria Gonorrhea: NEGATIVE
Trichomonas: NEGATIVE

## 2019-05-15 LAB — RPR: RPR Ser Ql: NONREACTIVE

## 2019-05-15 LAB — HIV ANTIBODY (ROUTINE TESTING W REFLEX): HIV Screen 4th Generation wRfx: NONREACTIVE

## 2019-05-26 ENCOUNTER — Other Ambulatory Visit: Payer: Self-pay | Admitting: Obstetrics and Gynecology

## 2020-05-22 ENCOUNTER — Telehealth: Payer: Self-pay | Admitting: Obstetrics and Gynecology

## 2020-05-22 NOTE — Telephone Encounter (Signed)
Patient needs annual exam prior to next refill.  

## 2020-05-25 NOTE — Telephone Encounter (Signed)
Pt called no answer LM via VM to call the office to schedule an annual exam with Dr. Marcelline Mates. Pt is aware via vm that [facility] will not refill her medication Seasonale until she has a annual exam with [facility].

## 2020-06-11 NOTE — Telephone Encounter (Signed)
Called pt lmtrc pt needs to schedule annual

## 2020-07-05 NOTE — Telephone Encounter (Signed)
Printed/mailed letter to patient to contact office to schedule an annual exam.

## 2021-03-14 ENCOUNTER — Encounter: Payer: Self-pay | Admitting: Certified Nurse Midwife

## 2021-03-14 ENCOUNTER — Other Ambulatory Visit (HOSPITAL_COMMUNITY)
Admission: RE | Admit: 2021-03-14 | Discharge: 2021-03-14 | Disposition: A | Discharge status: Home / Self Care | Payer: BC Managed Care – PPO | Source: Ambulatory Visit | Attending: Certified Nurse Midwife | Admitting: Certified Nurse Midwife

## 2021-03-14 ENCOUNTER — Other Ambulatory Visit: Payer: Self-pay

## 2021-03-14 ENCOUNTER — Encounter: Payer: Self-pay | Admitting: *Deleted

## 2021-03-14 ENCOUNTER — Ambulatory Visit (INDEPENDENT_AMBULATORY_CARE_PROVIDER_SITE_OTHER): Payer: Self-pay | Admitting: Certified Nurse Midwife

## 2021-03-14 VITALS — BP 127/85 | HR 87 | Ht 65.0 in | Wt 303.5 lb

## 2021-03-14 DIAGNOSIS — R109 Unspecified abdominal pain: Secondary | ICD-10-CM

## 2021-03-14 DIAGNOSIS — Z01419 Encounter for gynecological examination (general) (routine) without abnormal findings: Secondary | ICD-10-CM | POA: Insufficient documentation

## 2021-03-14 DIAGNOSIS — Z124 Encounter for screening for malignant neoplasm of cervix: Secondary | ICD-10-CM | POA: Insufficient documentation

## 2021-03-14 DIAGNOSIS — Z1231 Encounter for screening mammogram for malignant neoplasm of breast: Secondary | ICD-10-CM

## 2021-03-14 DIAGNOSIS — Z23 Encounter for immunization: Secondary | ICD-10-CM

## 2021-03-14 NOTE — Patient Instructions (Signed)
Preventive Care 21-39 Years Old, Female Preventive care refers to lifestyle choices and visits with your health care provider that can promote health and wellness. This includes:  A yearly physical exam. This is also called an annual wellness visit.  Regular dental and eye exams.  Immunizations.  Screening for certain conditions.  Healthy lifestyle choices, such as: ? Eating a healthy diet. ? Getting regular exercise. ? Not using drugs or products that contain nicotine and tobacco. ? Limiting alcohol use. What can I expect for my preventive care visit? Physical exam Your health care provider may check your:  Height and weight. These may be used to calculate your BMI (body mass index). BMI is a measurement that tells if you are at a healthy weight.  Heart rate and blood pressure.  Body temperature.  Skin for abnormal spots. Counseling Your health care provider may ask you questions about your:  Past medical problems.  Family's medical history.  Alcohol, tobacco, and drug use.  Emotional well-being.  Home life and relationship well-being.  Sexual activity.  Diet, exercise, and sleep habits.  Work and work environment.  Access to firearms.  Method of birth control.  Menstrual cycle.  Pregnancy history. What immunizations do I need? Vaccines are usually given at various ages, according to a schedule. Your health care provider will recommend vaccines for you based on your age, medical history, and lifestyle or other factors, such as travel or where you work.   What tests do I need? Blood tests  Lipid and cholesterol levels. These may be checked every 5 years starting at age 20.  Hepatitis C test.  Hepatitis B test. Screening  Diabetes screening. This is done by checking your blood sugar (glucose) after you have not eaten for a while (fasting).  STD (sexually transmitted disease) testing, if you are at risk.  BRCA-related cancer screening. This may be  done if you have a family history of breast, ovarian, tubal, or peritoneal cancers.  Pelvic exam and Pap test. This may be done every 3 years starting at age 21. Starting at age 30, this may be done every 5 years if you have a Pap test in combination with an HPV test. Talk with your health care provider about your test results, treatment options, and if necessary, the need for more tests.   Follow these instructions at home: Eating and drinking  Eat a healthy diet that includes fresh fruits and vegetables, whole grains, lean protein, and low-fat dairy products.  Take vitamin and mineral supplements as recommended by your health care provider.  Do not drink alcohol if: ? Your health care provider tells you not to drink. ? You are pregnant, may be pregnant, or are planning to become pregnant.  If you drink alcohol: ? Limit how much you have to 0-1 drink a day. ? Be aware of how much alcohol is in your drink. In the [place] one drink equals one 12 oz bottle of beer (355 mL), one 5 oz glass of wine (148 mL), or one 1 oz glass of hard liquor (44 mL).   Lifestyle  Take daily care of your teeth and gums. Brush your teeth every morning and night with fluoride toothpaste. Floss one time each day.  Stay active. Exercise for at least 30 minutes 5 or more days each week.  Do not use any products that contain nicotine or tobacco, such as cigarettes, e-cigarettes, and chewing tobacco. If you need help quitting, ask your health care provider.  Do not   use drugs.  If you are sexually active, practice safe sex. Use a condom or other form of protection to prevent STIs (sexually transmitted infections).  If you do not wish to become pregnant, use a form of birth control. If you plan to become pregnant, see your health care provider for a prepregnancy visit.  Find healthy ways to cope with stress, such as: ? Meditation, yoga, or listening to music. ? Journaling. ? Talking to a trusted  person. ? Spending time with friends and family. Safety  Always wear your seat belt while driving or riding in a vehicle.  Do not drive: ? If you have been drinking alcohol. Do not ride with someone who has been drinking. ? When you are tired or distracted. ? While texting.  Wear a helmet and other protective equipment during sports activities.  If you have firearms in your house, make sure you follow all gun safety procedures.  Seek help if you have been physically or sexually abused. What's next?  Go to your health care provider once a year for an annual wellness visit.  Ask your health care provider how often you should have your eyes and teeth checked.  Stay up to date on all vaccines. This information is not intended to replace advice given to you by your health care provider. Make sure you discuss any questions you have with your health care provider. Document Revised: 06/06/2020 Document Reviewed: 06/20/2018 Elsevier Patient Education  2021 Elsevier Inc.  

## 2021-03-14 NOTE — Addendum Note (Signed)
Addended by: Durwin Glaze on: 03/14/2021 02:38 PM   Modules accepted: Orders

## 2021-03-14 NOTE — Progress Notes (Signed)
GYNECOLOGY ANNUAL PREVENTATIVE CARE ENCOUNTER NOTE  History:     Shelley Lee is a 40 y.o. G0P0000 female here for a routine annual gynecologic exam.  Current complaints: none.   Denies abnormal vaginal bleeding, discharge, pelvic pain, problems with intercourse or other gynecologic concerns.     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  Gynecologic History Patient's last menstrual period was 03/03/2021. Contraception: none Last Pap: 03/08/2018. Results were: normal  Last mammogram: has not had one.   Upstream - 03/14/21 1332      Pregnancy Intention Screening   Does the patient want to become pregnant in the next year? Yes    Does the patient's partner want to become pregnant in the next year? Yes    Would the patient like to discuss contraceptive options today? No      Contraception Wrap Up   Current Method No Contraceptive Precautions    End Method No Contraception Precautions    Contraception Counseling Provided No          The pregnancy intention screening data noted above was reviewed. Potential methods of contraception were discussed. The patient elected to proceed with No Method - Other Reason.    Obstetric History OB History  Gravida Para Term Preterm AB Living  0 0 0 0 0 0  SAB IAB Ectopic Multiple Live Births  0 0 0 0      Past Medical History:  Diagnosis Date  . Fibroid   . Hyperlipidemia   . Menorrhagia with regular cycle 02/01/2016  . Uterine fibroid 02/18/2016    Past Surgical History:  Procedure Laterality Date  . UTERINE FIBROID SURGERY  2013    Current Outpatient Medications on File Prior to Visit  Medication Sig Dispense Refill  . levonorgestrel-ethinyl estradiol (SEASONALE) 0.15-0.03 MG tablet TAKE 1 TABLET BY MOUTH EVERY DAY (Patient not taking: Reported on 03/14/2021) 91 tablet 0   No current  facility-administered medications on file prior to visit.    No Known Allergies  Social History:  reports that she has never smoked. She has never used smokeless tobacco. She reports current alcohol use. She reports that she does not use drugs.  Family History  Problem Relation Age of Onset  . Hypertension Mother   . Healthy Father   . Pulmonary embolism Sister   . Clotting disorder Brother   . Breast cancer Paternal Aunt   . Early death Maternal Grandmother   . Stroke Maternal Grandmother   . Hypertension Maternal Grandfather   . Cancer Paternal Grandfather   . Heart disease Paternal Grandfather     The following portions of the patient's history were reviewed and updated as appropriate: allergies, current medications, past family history, past medical history, past social history, past surgical history and problem list.  Review of Systems Pertinent items noted in HPI and remainder of comprehensive ROS otherwise negative.  Physical Exam:  BP 127/85   Pulse 87   Ht 5\' 5"  (1.651 m)   Wt (!) 303 lb 8 oz (137.7 kg)   LMP 03/03/2021   BMI 50.51 kg/m  CONSTITUTIONAL: Well-developed, well-nourished, obese female in no acute distress.  HENT:  Normocephalic, atraumatic, External right and left ear normal. Oropharynx is clear and moist EYES: Conjunctivae and EOM are normal. Pupils are equal, round, and reactive to light. No scleral icterus.  NECK: Normal range of motion, supple, no masses.  Normal thyroid.  SKIN: Skin is warm and dry. No rash noted. Not diaphoretic. No erythema. No pallor. MUSCULOSKELETAL: Normal range of motion. No tenderness.  No cyanosis, clubbing, or edema.  2+ distal pulses. NEUROLOGIC: Alert and oriented to person, place, and time. Normal reflexes, muscle tone coordination.  PSYCHIATRIC: Normal mood and affect. Normal behavior. Normal judgment and thought content. CARDIOVASCULAR: Normal heart rate noted, regular rhythm RESPIRATORY: Clear to auscultation  bilaterally. Effort and breath sounds normal, no problems with respiration noted. BREASTS: Symmetric in size. No masses, tenderness, skin changes, nipple drainage, or lymphadenopathy bilaterally.  ABDOMEN: Soft, no distention noted.  No tenderness, rebound or guarding.  PELVIC: Normal appearing external genitalia and urethral meatus; normal appearing vaginal mucosa and cervix.  No abnormal discharge noted.  Pap smear obtained.  Normal uterine size, no other palpable masses, no uterine or adnexal tenderness.  .Difficult to assess due to body habitus.    Assessment and Plan:    There are no diagnoses linked to this encounter.  Pap:Will follow up results of pap smear and manage accordingly. Mammogram : ordered Labs: none Refills/orders: tetanus today  Referral: GI Routine preventative health maintenance measures emphasized. Please refer to After Visit Summary for other counseling recommendations.      Philip Aspen, CNM [facility]

## 2021-03-15 ENCOUNTER — Encounter: Payer: Self-pay | Admitting: *Deleted

## 2021-03-17 LAB — CYTOLOGY - PAP
Chlamydia: NEGATIVE
Comment: NEGATIVE
Comment: NEGATIVE
Comment: NEGATIVE
Comment: NORMAL
Diagnosis: NEGATIVE
High risk HPV: NEGATIVE
Neisseria Gonorrhea: NEGATIVE
Trichomonas: NEGATIVE

## 2021-03-24 ENCOUNTER — Encounter: Payer: Self-pay | Admitting: *Deleted

## 2022-03-15 ENCOUNTER — Encounter: Payer: Self-pay | Admitting: Certified Nurse Midwife

## 2022-05-24 ENCOUNTER — Encounter: Payer: Self-pay | Admitting: Certified Nurse Midwife

## 2022-07-17 ENCOUNTER — Encounter: Payer: Self-pay | Admitting: Certified Nurse Midwife

## 2022-12-31 ENCOUNTER — Ambulatory Visit
Admission: RE | Admit: 2022-12-31 | Discharge: 2022-12-31 | Disposition: A | Discharge status: Home / Self Care | Payer: Commercial Managed Care - PPO | Source: Ambulatory Visit | Attending: Family Medicine | Admitting: Family Medicine

## 2022-12-31 VITALS — BP 147/84 | HR 79 | Temp 98.7°F | Resp 16

## 2022-12-31 DIAGNOSIS — R059 Cough, unspecified: Secondary | ICD-10-CM

## 2022-12-31 DIAGNOSIS — J029 Acute pharyngitis, unspecified: Secondary | ICD-10-CM | POA: Diagnosis not present

## 2022-12-31 MED ORDER — PREDNISONE 10 MG (21) PO TBPK: ORAL_TABLET | Freq: Every day | ORAL | 0 refills | Status: DC

## 2022-12-31 MED ORDER — AMOXICILLIN-POT CLAVULANATE 875-125 MG PO TABS: 1.0000 | ORAL_TABLET | Freq: Two times a day (BID) | ORAL | 0 refills | Status: DC

## 2022-12-31 NOTE — ED Triage Notes (Signed)
Pt presents to [facility] with [facility] of sore throat since 3/1. Reports fevers. Musinex and dayquil at home.   Recently sick 3 weeks ago with same symptoms and she was getting better but now its back.

## 2022-12-31 NOTE — Discharge Instructions (Addendum)
Instructed patient to take medications as directed with food to completion.  Advised patient to take prednisone with first dose of Augmentin for the next 7 days then to completion.  Encouraged patient to increase daily water intake to 64 ounces per day while taking these medications.  Advised if symptoms worsen and/or unresolved please follow-up with PCP or here for further evaluation.

## 2022-12-31 NOTE — ED Provider Notes (Signed)
[facility]    CSN: [id number] Arrival date & time: 12/31/22  [id number]      History   Chief Complaint Chief Complaint  Patient presents with   Sore Throat    10 days - Entered by patient    HPI Shelley Lee is a 42 y.o. female.   HPI Pleasant 42 year old female presents with sore throat since 12/22/2022.  Reports occasional fevers and has taken OTC Mucinex and DayQuil at home with little to no relief.  Reports was recently sick 3 weeks ago.  PMH significant for HLD, menorrhagia with regular cycle, morbid obesity, and uterine fibroid.  Past Medical History:  Diagnosis Date   Fibroid    Hyperlipidemia    Menorrhagia with regular cycle 02/01/2016   Uterine fibroid 02/18/2016    Patient Active Problem List   Diagnosis Date Noted   Uterine fibroid 02/18/2016   Menorrhagia with regular cycle 02/01/2016    Past Surgical History:  Procedure Laterality Date   UTERINE FIBROID SURGERY  2013    OB History     Gravida  0   Para  0   Term  0   Preterm  0   AB  0   Living  0      SAB  0   IAB  0   Ectopic  0   Multiple  0   Live Births               Home Medications    Prior to Admission medications   Medication Sig Start Date End Date Taking? Authorizing Provider  amoxicillin-clavulanate (AUGMENTIN) 875-125 MG tablet Take 1 tablet by mouth every 12 (twelve) hours. 12/31/22  Yes Eliezer Lofts, FNP  predniSONE (STERAPRED UNI-PAK 21 TAB) 10 MG (21) TBPK tablet Take by mouth daily. Take 6 tabs by mouth daily  for 2 days, then 5 tabs for 2 days, then 4 tabs for 2 days, then 3 tabs for 2 days, 2 tabs for 2 days, then 1 tab by mouth daily for 2 days 12/31/22  Yes Eliezer Lofts, FNP  levonorgestrel-ethinyl estradiol (SEASONALE) 0.15-0.03 MG tablet TAKE 1 TABLET BY MOUTH EVERY DAY Patient not taking: Reported on 03/14/2021 05/22/20   Rubie Maid, MD    Family History Family History  Problem Relation Age of Onset   Hypertension Mother    Healthy Father     Pulmonary embolism Sister    Clotting disorder Brother    Breast cancer Paternal Aunt    Early death Maternal Grandmother    Stroke Maternal Grandmother    Hypertension Maternal Grandfather    Cancer Paternal Grandfather    Heart disease Paternal Grandfather     Social History Social History   Tobacco Use   Smoking status: Never   Smokeless tobacco: Never  Vaping Use   Vaping Use: Never used  Substance Use Topics   Alcohol use: Yes    Comment: Once weekly    Drug use: No     Allergies   Patient has no known allergies.   Review of Systems Review of Systems  HENT:  Positive for sore throat.   All other systems reviewed and are negative.    Physical Exam Triage Vital Signs ED Triage Vitals  Enc Vitals Group     BP 12/31/22 0851 (!) 147/84     Pulse Rate 12/31/22 0851 79     Resp 12/31/22 0851 16     Temp 12/31/22 0851 98.7 F (37.1 C)  Temp src --      SpO2 12/31/22 0851 98 %     Weight --      Height --      Head Circumference --      Peak Flow --      Pain Score 12/31/22 0849 5     Pain Loc --      Pain Edu? --      Excl. in [place]? --    No data found.  Updated Vital Signs BP (!) 147/84   Pulse 79   Temp 98.7 F (37.1 C)   Resp 16   LMP 12/04/2022 (Approximate)   SpO2 98%     Physical Exam Vitals and nursing note reviewed.  Constitutional:      General: She is not in acute distress.    Appearance: She is obese. She is not ill-appearing.  HENT:     Head: Normocephalic and atraumatic.     Right Ear: Tympanic membrane, ear canal and external ear normal.     Left Ear: Tympanic membrane, ear canal and external ear normal.     Nose: Nose normal.     Mouth/Throat:     Mouth: Mucous membranes are moist.     Pharynx: Oropharynx is clear. Uvula midline. Posterior oropharyngeal erythema present. No oropharyngeal exudate or uvula swelling.  Eyes:     Extraocular Movements: Extraocular movements intact.     Conjunctiva/sclera: Conjunctivae  normal.     Pupils: Pupils are equal, round, and reactive to light.  Cardiovascular:     Rate and Rhythm: Normal rate and regular rhythm.     Pulses: Normal pulses.     Heart sounds: Normal heart sounds. No murmur heard. Pulmonary:     Effort: Pulmonary effort is normal.     Breath sounds: Normal breath sounds. No wheezing, rhonchi or rales.     Comments: Infrequent nonproductive cough noted Musculoskeletal:        General: Normal range of motion.     Cervical back: Normal range of motion and neck supple. No tenderness.  Lymphadenopathy:     Cervical: Cervical adenopathy present.  Skin:    General: Skin is warm and dry.  Neurological:     General: No focal deficit present.     Mental Status: She is alert and oriented to person, place, and time. Mental status is at baseline.  Psychiatric:        Mood and Affect: Mood normal.        Behavior: Behavior normal.        Thought Content: Thought content normal.      UC Treatments / Results  Labs (all labs ordered are listed, but only abnormal results are displayed) Labs Reviewed  POCT RAPID STREP A (OFFICE)    EKG   Radiology No results found.  Procedures Procedures (including critical care time)  Medications Ordered in UC Medications - No data to display  Initial Impression / Assessment and Plan / UC Course  I have reviewed the triage vital signs and the nursing notes.  Pertinent labs & imaging results that were available during my care of the patient were reviewed by me and considered in my medical decision making (see chart for details).     MDM: 1.  Acute pharyngitis-Rx'd Augmentin; 2.  Cough-Rx'd Sterapred Unipak. Instructed patient to take medications as directed with food to completion.  Advised patient to take prednisone with first dose of Augmentin for the next 7 days then to completion.  Encouraged  patient to increase daily water intake to 64 ounces per day while taking these medications.  Advised if symptoms  worsen and/or unresolved please follow-up with PCP or here for further evaluation.  Patient discharged home, hemodynamically stable.  Final Clinical Impressions(s) / UC Diagnoses   Final diagnoses:  Acute pharyngitis, unspecified etiology  Cough, unspecified type     Discharge Instructions      Instructed patient to take medications as directed with food to completion.  Advised patient to take prednisone with first dose of Augmentin for the next 7 days then to completion.  Encouraged patient to increase daily water intake to 64 ounces per day while taking these medications.  Advised if symptoms worsen and/or unresolved please follow-up with PCP or here for further evaluation.     ED Prescriptions     Medication Sig Dispense Auth. Provider   amoxicillin-clavulanate (AUGMENTIN) 875-125 MG tablet Take 1 tablet by mouth every 12 (twelve) hours. 14 tablet Eliezer Lofts, FNP   predniSONE (STERAPRED UNI-PAK 21 TAB) 10 MG (21) TBPK tablet Take by mouth daily. Take 6 tabs by mouth daily  for 2 days, then 5 tabs for 2 days, then 4 tabs for 2 days, then 3 tabs for 2 days, 2 tabs for 2 days, then 1 tab by mouth daily for 2 days 42 tablet Eliezer Lofts, FNP      PDMP not reviewed this encounter.   Eliezer Lofts, [place] 12/31/22 [phone]

## 2023-01-01 ENCOUNTER — Telehealth: Payer: Self-pay

## 2023-01-01 NOTE — Telephone Encounter (Signed)
TCT pt to follow up from recent visit. Pt denies any needs at this time. 

## 2023-01-16 ENCOUNTER — Ambulatory Visit: Payer: Commercial Managed Care - PPO | Admitting: Family Medicine

## 2023-02-01 ENCOUNTER — Ambulatory Visit (INDEPENDENT_AMBULATORY_CARE_PROVIDER_SITE_OTHER): Payer: Self-pay | Admitting: Family Medicine

## 2023-02-01 ENCOUNTER — Encounter: Payer: Self-pay | Admitting: Family Medicine

## 2023-02-01 VITALS — BP 114/73 | HR 89 | Temp 98.2°F | Resp 18 | Ht 65.0 in | Wt 320.3 lb

## 2023-02-01 DIAGNOSIS — R7302 Impaired glucose tolerance (oral): Secondary | ICD-10-CM

## 2023-02-01 DIAGNOSIS — Z0000 Encounter for general adult medical examination without abnormal findings: Secondary | ICD-10-CM

## 2023-02-01 DIAGNOSIS — Z7689 Persons encountering health services in other specified circumstances: Secondary | ICD-10-CM

## 2023-02-01 DIAGNOSIS — Z13220 Encounter for screening for lipoid disorders: Secondary | ICD-10-CM

## 2023-02-01 DIAGNOSIS — Z1231 Encounter for screening mammogram for malignant neoplasm of breast: Secondary | ICD-10-CM

## 2023-02-01 DIAGNOSIS — R5382 Chronic fatigue, unspecified: Secondary | ICD-10-CM

## 2023-02-01 NOTE — Progress Notes (Signed)
Complete physical exam  Patient: Shelley Lee   DOB: 1981-05-14   42 y.o. Female  MRN: [id number]  Subjective:    Chief Complaint  Patient presents with   Establish Care    Annual physical    Annual Exam    Patient states that she does not want a pap smear at this time    Shelley Lee is a 42 y.o. female who presents today for a complete physical exam. She reports consuming a general diet. Home exercise routine includes walking her dog. She generally feels well. She reports sleeping fairly well. She does not have additional problems to discuss today.  Does report getting 5 hours of sleep at night so she does stay fatigued. Has hx of vitamin D deficiency per chart review, not on any supplements.  Would like referral for mammogram. She has had 2 aunts with breast cancer  in their 82s.  Not taking any medicines currently. Up to date with pap smear.    Flowsheet Row Office Visit from 02/01/2023 in [facility]  PHQ-9 Total Score 4       Most recent fall risk assessment:    02/01/2023   10:53 AM  Fall Risk   Falls in the past year? 0  Number falls in past yr: 0  Injury with Fall? 0  Risk for fall due to : No Fall Risks  Follow up Falls evaluation completed     Most recent depression screenings:    06/25/2018    9:15 AM  PHQ 2/9 Scores  PHQ - 2 Score 0    Vision:Within last year  Patient Active Problem List   Diagnosis Date Noted   Uterine fibroid 02/18/2016   Menorrhagia with regular cycle 02/01/2016   Past Medical History:  Diagnosis Date   Hyperlipidemia    Menorrhagia with regular cycle 02/01/2016   Uterine fibroid 02/18/2016   Past Surgical History:  Procedure Laterality Date   UTERINE FIBROID SURGERY  2013   Social History   Socioeconomic History   Marital status: Single    Spouse name: Not on file   Number of children: Not on file   Years of education: Not on file   Highest education level: Not on file   Occupational History   Not on file  Tobacco Use   Smoking status: Never   Smokeless tobacco: Never  Vaping Use   Vaping Use: Never used  Substance and Sexual Activity   Alcohol use: Yes    Comment: Once weekly    Drug use: No   Sexual activity: Yes    Partners: Male, Female    Birth control/protection: None  Other Topics Concern   Not on file  Social History Narrative   Not on file   Social Determinants of Health   Financial Resource Strain: Not on file  Food Insecurity: Not on file  Transportation Needs: Not on file  Physical Activity: Not on file  Stress: Not on file  Social Connections: Not on file  Intimate Partner Violence: Not on file      Patient Care Team: Pcp, No as PCP - General   Outpatient Medications Prior to Visit  Medication Sig   [DISCONTINUED] amoxicillin-clavulanate (AUGMENTIN) 875-125 MG tablet Take 1 tablet by mouth every 12 (twelve) hours.   [DISCONTINUED] levonorgestrel-ethinyl estradiol (SEASONALE) 0.15-0.03 MG tablet TAKE 1 TABLET BY MOUTH EVERY DAY (Patient not taking: Reported on 03/14/2021)   [DISCONTINUED] predniSONE (STERAPRED UNI-PAK 21 TAB) 10  MG (21) TBPK tablet Take by mouth daily. Take 6 tabs by mouth daily  for 2 days, then 5 tabs for 2 days, then 4 tabs for 2 days, then 3 tabs for 2 days, 2 tabs for 2 days, then 1 tab by mouth daily for 2 days   No facility-administered medications prior to visit.    Review of Systems  Constitutional:  Positive for malaise/fatigue.  All other systems reviewed and are negative.        Objective:     BP 114/73   Pulse 89   Temp 98.2 F (36.8 C) (Oral)   Resp 18   Ht 5\' 5"  (1.651 m)   Wt (!) 320 lb 4.8 oz (145.3 kg)   LMP 01/27/2023 (Exact Date)   SpO2 100%   BMI 53.30 kg/m  BP Readings from Last 3 Encounters:  02/01/23 114/73  12/31/22 (!) 147/84  03/14/21 127/85      Physical Exam Vitals and nursing note reviewed.  Constitutional:      Appearance: Normal appearance. She is  obese.  HENT:     Head: Normocephalic and atraumatic.     Right Ear: Tympanic membrane, ear canal and external ear normal.     Left Ear: Tympanic membrane, ear canal and external ear normal.     Nose: Nose normal.     Mouth/Throat:     Mouth: Mucous membranes are moist.     Pharynx: Oropharynx is clear.  Eyes:     Conjunctiva/sclera: Conjunctivae normal.     Pupils: Pupils are equal, round, and reactive to light.  Cardiovascular:     Rate and Rhythm: Normal rate and regular rhythm.     Pulses: Normal pulses.     Heart sounds: Normal heart sounds.  Pulmonary:     Effort: Pulmonary effort is normal.     Breath sounds: Normal breath sounds.  Abdominal:     General: Abdomen is flat. Bowel sounds are normal.  Musculoskeletal:        General: Normal range of motion.  Skin:    General: Skin is warm.     Capillary Refill: Capillary refill takes less than 2 seconds.  Neurological:     General: No focal deficit present.     Mental Status: She is alert and oriented to person, place, and time. Mental status is at baseline.  Psychiatric:        Mood and Affect: Mood normal.        Behavior: Behavior normal.        Thought Content: Thought content normal.        Judgment: Judgment normal.     No results found for any visits on 02/01/23.     Assessment & Plan:    Routine Health Maintenance and Physical Exam  Immunization History  Administered Date(s) Administered   Influenza Split 12/21/2014   Influenza,inj,Quad PF,6+ Mos 08/22/2015, 08/22/2016, 08/22/2017, 07/18/2018, 08/27/2019   Tdap 03/14/2021    Health Maintenance  Topic Date Due   COVID-19 Vaccine (1) Never done   INFLUENZA VACCINE  05/24/2023   PAP SMEAR-Modifier  03/14/2026   DTaP/Tdap/Td (2 - Td or Tdap) 03/15/2031   Hepatitis C Screening  Completed   HIV Screening  Completed   HPV VACCINES  Aged Out    Discussed health benefits of physical activity, and encouraged her to engage in regular exercise appropriate  for her age and condition.  Problem List Items Addressed This Visit   None  No follow-ups on file.  Annual physical exam  Encounter to establish care with new doctor  Impaired glucose tolerance -     CBC with Differential/Platelet -     Comprehensive metabolic panel -     Hemoglobin A1c  Screening mammogram for breast cancer -     3D Screening Mammogram, Left and Right; Future  Encounter for lipid screening for cardiovascular disease -     Lipid panel  Chronic fatigue -     TSH -     T4, free -     VITAMIN D 25 Hydroxy (Vit-D Deficiency, Fractures)   Screening labs including thyroid and vitamin d due to fatigue and hx of vitamin D deficiency Refer for mammogram See back in 1 year sooner prn     Suzan SlickALETHEA Y RUCKER, MD

## 2023-02-02 ENCOUNTER — Other Ambulatory Visit: Payer: Self-pay | Admitting: Family Medicine

## 2023-02-02 DIAGNOSIS — E559 Vitamin D deficiency, unspecified: Secondary | ICD-10-CM

## 2023-02-02 DIAGNOSIS — D508 Other iron deficiency anemias: Secondary | ICD-10-CM

## 2023-02-02 LAB — CBC WITH DIFFERENTIAL/PLATELET
Basophils Absolute: 0 10*3/uL (ref 0.0–0.2)
Basos: 1 %
EOS (ABSOLUTE): 0.1 10*3/uL (ref 0.0–0.4)
Eos: 2 %
Hematocrit: 32.7 % — ABNORMAL LOW (ref 34.0–46.6)
Hemoglobin: 9.5 g/dL — ABNORMAL LOW (ref 11.1–15.9)
Immature Grans (Abs): 0 10*3/uL (ref 0.0–0.1)
Immature Granulocytes: 0 %
Lymphocytes Absolute: 2 10*3/uL (ref 0.7–3.1)
Lymphs: 31 %
MCH: 21.5 pg — ABNORMAL LOW (ref 26.6–33.0)
MCHC: 29.1 g/dL — ABNORMAL LOW (ref 31.5–35.7)
MCV: 74 fL — ABNORMAL LOW (ref 79–97)
Monocytes Absolute: 0.5 10*3/uL (ref 0.1–0.9)
Monocytes: 7 %
Neutrophils Absolute: 3.9 10*3/uL (ref 1.4–7.0)
Neutrophils: 59 %
Platelets: 650 10*3/uL — ABNORMAL HIGH (ref 150–450)
RBC: 4.42 x10E6/uL (ref 3.77–5.28)
RDW: 15.8 % — ABNORMAL HIGH (ref 11.7–15.4)
WBC: 6.5 10*3/uL (ref 3.4–10.8)

## 2023-02-02 LAB — COMPREHENSIVE METABOLIC PANEL
ALT: 11 IU/L (ref 0–32)
AST: 13 IU/L (ref 0–40)
Albumin/Globulin Ratio: 1.4 (ref 1.2–2.2)
Albumin: 3.8 g/dL — ABNORMAL LOW (ref 3.9–4.9)
Alkaline Phosphatase: 69 IU/L (ref 44–121)
BUN/Creatinine Ratio: 12 (ref 9–23)
BUN: 12 mg/dL (ref 6–24)
Bilirubin Total: 0.2 mg/dL (ref 0.0–1.2)
CO2: 23 mmol/L (ref 20–29)
Calcium: 9.1 mg/dL (ref 8.7–10.2)
Chloride: 103 mmol/L (ref 96–106)
Creatinine, Ser: 0.97 mg/dL (ref 0.57–1.00)
Globulin, Total: 2.8 g/dL (ref 1.5–4.5)
Glucose: 102 mg/dL — ABNORMAL HIGH (ref 70–99)
Potassium: 4.4 mmol/L (ref 3.5–5.2)
Sodium: 141 mmol/L (ref 134–144)
Total Protein: 6.6 g/dL (ref 6.0–8.5)
eGFR: 75 mL/min/{1.73_m2} (ref >59)

## 2023-02-02 LAB — HEMOGLOBIN A1C
Est. average glucose Bld gHb Est-mCnc: 120 mg/dL
Hgb A1c MFr Bld: 5.8 % — ABNORMAL HIGH (ref 4.8–5.6)

## 2023-02-02 LAB — LIPID PANEL
Chol/HDL Ratio: 4.1 ratio (ref 0.0–4.4)
Cholesterol, Total: 161 mg/dL (ref 100–199)
HDL: 39 mg/dL — ABNORMAL LOW (ref >39)
LDL Chol Calc (NIH): 104 mg/dL — ABNORMAL HIGH (ref 0–99)
Triglycerides: 95 mg/dL (ref 0–149)
VLDL Cholesterol Cal: 18 mg/dL (ref 5–40)

## 2023-02-02 LAB — T4, FREE: Free T4: 1.09 ng/dL (ref 0.82–1.77)

## 2023-02-02 LAB — VITAMIN D 25 HYDROXY (VIT D DEFICIENCY, FRACTURES): Vit D, 25-Hydroxy: 28.9 ng/mL — ABNORMAL LOW (ref 30.0–100.0)

## 2023-02-02 LAB — TSH: TSH: 2.43 u[IU]/mL (ref 0.450–4.500)

## 2023-02-02 MED ORDER — FERROUS SULFATE 325 (65 FE) MG PO TBEC: 325.0000 mg | DELAYED_RELEASE_TABLET | Freq: Every day | ORAL | 1 refills | Status: AC

## 2023-02-02 MED ORDER — VITAMIN D (ERGOCALCIFEROL) 1.25 MG (50000 UNIT) PO CAPS: 50000.0000 [IU] | ORAL_CAPSULE | ORAL | 0 refills | Status: AC

## 2023-02-08 ENCOUNTER — Encounter: Payer: Self-pay | Admitting: Family Medicine

## 2023-02-16 ENCOUNTER — Other Ambulatory Visit: Payer: Self-pay | Admitting: Family Medicine

## 2023-02-16 DIAGNOSIS — E559 Vitamin D deficiency, unspecified: Secondary | ICD-10-CM

## 2023-06-26 ENCOUNTER — Other Ambulatory Visit: Payer: Self-pay | Admitting: Family Medicine

## 2023-06-26 DIAGNOSIS — Z1231 Encounter for screening mammogram for malignant neoplasm of breast: Secondary | ICD-10-CM

## 2023-07-03 ENCOUNTER — Ambulatory Visit: Payer: BLUE CROSS/BLUE SHIELD

## 2023-07-03 DIAGNOSIS — Z1231 Encounter for screening mammogram for malignant neoplasm of breast: Secondary | ICD-10-CM

## 2024-01-22 NOTE — Progress Notes (Signed)
 HPI:   43 y.o. G0P0000 female, Patient's last menstrual period was 12/26/2023., who presents for Gynecologic Exam. Breast biopsy negative 2025- for screening in 1 yr 2026 PCP 11/2023- labs done Cycles regular- anemia- taking iron. H/x fibroids- previous had pain with cycles than and  no progressively needing medication for pain. Denies odor or itching Increase in discharge- 2-3 months Sexually active- accepts vag cx History reviewed. No pertinent past medical history.  Past Surgical History:  Procedure Laterality Date  . ACESSA FIBROID ABLATION N/A     Social History   Socioeconomic History  . Marital status: Unknown    Spouse name: None  . Number of children: None  . Years of education: None  . Highest education level: None  Occupational History  . None  Tobacco Use  . Smoking status: Never  . Smokeless tobacco: Never  Substance and Sexual Activity  . Alcohol use: Yes    Alcohol/week: 2.0 standard drinks of alcohol    Types: 1 Glasses of wine, 1 Shots of liquor per week    Comment: 2 x per month  . 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 Sexual activity: Yes  Other Topics Concern  . None  Social History Narrative  . None   Social Drivers of Health   Food Insecurity: Low Risk  (11/27/2023)   Food vital sign   . Within the past 12 months, you worried that your food would run out before you got money to buy more: Never true   . Within the past 12 months, the food you bought just didn't last and you didn't have money to get more: Never true  Transportation Needs: No Transportation Needs (11/27/2023)   Transportation   . In the past 12 months, has lack of reliable transportation kept you from medical appointments, meetings, work or from getting things needed for daily living? : No  Safety: Low Risk  (11/27/2023)   Safety   . How often does anyone, including family and friends, physically hurt you?: Never   . How often does anyone, including family and friends,  insult or talk down to you?: Never   . How often does anyone, including family and friends, threaten you with harm?: Never   . How often does anyone, including family and friends, scream or curse at you?: Never  Living Situation: Low Risk  (11/27/2023)   Living Situation   . What is your living situation today?: I have a steady place to live   . Think about the place you live. Do you have problems with any of the following? Choose all that apply:: None/None on this list    Family History  Problem Relation Name Age of Onset  . Cancer Mother    . Hypertension Mother    . Diabetes Mother    . Cancer Father    . Hypertension Father    . Diabetes Father      OB History  Gravida Para Term Preterm AB Living  0 0 0 0 0 0  SAB IAB Ectopic Molar Multiple Live Births  0 0 0 0 0 0    No Known Allergies  Current Outpatient Medications  Medication Sig Dispense Refill  . ferrous sulfate  (Iron, ferrous sulfate ,) 325 mg (65 mg iron) tablet Take 1 tablet (325 mg total) by mouth daily before breakfast. 90 tablet 3  . naproxen (NAPROSYN) 500 mg tablet Take 1 tablet (500 mg total) by mouth in the  morning and 1 tablet (500 mg total) in the evening. Take with meals. Do all this for 7 days. 14 tablet 0   No current facility-administered medications for this visit.    @AMB @  @HEALTHMAINTENANCE @  Review of Systems:  Denies breast, bowel, urinary, and vaginal symptoms.   Other systems negative except as noted in HPI.  EXAM: BP 128/84   Wt (!) 146 kg (321 lb)   LMP 12/26/2023   BMI 53.42 kg/m , Body mass index is 53.42 kg/m. Well developed, obese, female Normal grooming, mood, affect, and orientation Skin without significant lesions Breasts symmetric without mass, axillary adenopathy, or skin changes Nipples without abnormality or discharge  Lungs clear to ausultation with normal respiratory effort Heart regular rate & rhythm, no murmur appreciated Abdomen soft without tenderness, mass,  hepatosplenomegaly, or hernia No inguinal adenopathy noted Normal female external genitalia Urethra, meatus, and bladder normal Vagina without lesion; physiologic discharge Cervix smooth and closed Uterus difficult to palpate due to body habitus Adnexa without mass, tenderness, fullness Anus & perineum without lesion Rectal not indicated  Chaperone present for exam  IMPRESSION: 43 y.o. G0P0000 1. Encounter for annual routine gynecological examination  Chlamydia / Gonococcus (GC), NAAT   Bacterial Vaginosis (BV), Candida, and Trichomonas via NAA (Swab)   Chlamydia / Gonococcus (GC), NAAT   Bacterial Vaginosis (BV), Candida, and Trichomonas via NAA (Swab)    2. Cervical cancer screening  Pap Smear, Liquid Based   Pap Smear, Liquid Based   Human Papilloma Virus (HPV) HR With 16/18 Genotype    3. Dysmenorrhea  US  Transvaginal Only    .  PLAN: 1. Pap 2. Vag cx 3.sono Will call with results  Orders Placed This Encounter  Procedures  . US  Transvaginal Only    Standing Status:   Future    Number of Occurrences:   1    Expected Date:   01/22/2024    Expiration Date:   03/23/2026    Is the patient pregnant?:   No    Scheduling group::   In Clinic  . Chlamydia / Gonococcus (GC), NAAT    Standing Status:   Future    Number of Occurrences:   1    Expected Date:   01/22/2024    Expiration Date:   01/21/2025    Release to patient::   Immediate  . Bacterial Vaginosis (BV), Candida, and Trichomonas via NAA (Swab)    Standing Status:   Future    Number of Occurrences:   1    Expected Date:   01/22/2024    Expiration Date:   01/21/2025    Release to patient::   Immediate  . Human Papilloma Virus (HPV) HR With 16/18 Genotype    No orders of the defined types were placed in this encounter.

## 2024-02-04 ENCOUNTER — Encounter: Payer: Commercial Managed Care - PPO | Admitting: Family Medicine

## 2024-02-26 NOTE — Progress Notes (Signed)
 Subjective  Patient ID: Shelley Lee  is a 43 y.o. female here in clinic for:  Chief Complaint  Patient presents with  . Follow-up     The following information was reviewed by members of the visit team:   No past medical history on file.   Past Surgical History:  Procedure Laterality Date  . ACESSA FIBROID ABLATION N/A      Family History  Problem Relation Name Age of Onset  . Cancer Mother    . Hypertension Mother    . Diabetes Mother    . Cancer Father    . Hypertension Father    . Diabetes Father       Social History   Socioeconomic History  . Marital status: Unknown    Spouse name: Not on file  . Number of children: Not on file  . Years of education: Not on file  . Highest education level: Not on file  Occupational History  . Not on file  Tobacco Use  . Smoking status: Never  . Smokeless tobacco: Never  Substance and Sexual Activity  . Alcohol use: Yes    Alcohol/week: 2.0 standard drinks of alcohol    Types: 1 Glasses of wine, 1 Shots of liquor per week    Comment: 2 x per month  . 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 Sexual activity: Yes  Other Topics Concern  . Not on file  Social History Narrative  . Not on file   Social Drivers of Health   Food Insecurity: Low Risk  (11/27/2023)   Food vital sign   . Within the past 12 months, you worried that your food would run out before you got money to buy more: Never true   . Within the past 12 months, the food you bought just didn't last and you didn't have money to get more: Never true  Transportation Needs: No Transportation Needs (11/27/2023)   Transportation   . In the past 12 months, has lack of reliable transportation kept you from medical appointments, meetings, work or from getting things needed for daily living? : No  Safety: Low Risk  (11/27/2023)   Safety   . How often does anyone, including family and friends, physically hurt you?: Never   . How often does anyone, including  family and friends, insult or talk down to you?: Never   . How often does anyone, including family and friends, threaten you with harm?: Never   . How often does anyone, including family and friends, scream or curse at you?: Never  Living Situation: Low Risk  (11/27/2023)   Living Situation   . What is your living situation today?: I have a steady place to live   . Think about the place you live. Do you have problems with any of the following? Choose all that apply:: None/None on this list      Current Outpatient Medications on File Prior to Visit  Medication Sig Dispense Refill  . ferrous sulfate  (Iron, ferrous sulfate ,) 325 mg (65 mg iron) tablet Take 1 tablet (325 mg total) by mouth daily before breakfast. 90 tablet 3  . naproxen (NAPROSYN) 500 mg tablet Take 1 tablet (500 mg total) by mouth in the morning and 1 tablet (500 mg total) in the evening. Take with meals. Do all this for 7 days. 14 tablet 0   No current facility-administered medications on file prior to visit.     Patient has  no known allergies.   Most recent PHQ-2 results: Patient Health Questionnaire-2 Score: 0 (02/26/2024  8:22 AM)  Most recent PHQ-9 result:   PHQ-9 Question # 9   Interpretation: PHQ-2 Interpretation: Negative (None-minimal Depression Severity) (02/26/2024  8:22 AM)    Depression Plan: Normal/Negative Screening   Recent Results (from the past 12 weeks)  Tissue Exam   Collection Time: 01/08/24 10:29 AM  Result Value Ref Range   Case Report      Surgical Pathology Report                         Case: [id number]                              Authorizing Provider:  Elveria Tedi Kaiser, Collected:           01/08/2024 1029                                    MD                                                                          Ordering Location:     [facility]  Received:            01/08/2024 1419                                    [facility] - [facility]                                                                                   RADIOLOGY [facility]                                                            Pathologist:           Bernardino Debby Means, MD                                                        Specimen:    Breast, Left, left breast bx  Final Diagnosis       A.  LEFT BREAST, NEEDLE CORE BIOPSY:  Fibrocystic change with apocrine metaplasia.  Negative for malignancy.     Gross Description      A. Received in formalin labeled with the patient's name, medical record number, and left breast are 3 tan-yellow fibrous adipose tissue cores, 0.8 cm - 1.1 cm, entirely submitted wrapped in lens paper in A1.  Removal Time: 10:29 AM on 01/08/2024 Time in Formalin: 10:30 AM on 01/08/2024 Cold ischemia and fixation times meet requirements specified in the latest version of the ASCO / CAP guidelines.  Gross completed by [facility] 01/08/2024 2:47 PM.  All specimens are formalin-processed and paraffin-embedded unless otherwise noted.     Clinical Information      Diagnosis: N63.20 - Mass of left breast on mammogram [ICD-10-CM]  Left breast ultrasound guided needle core biopsy 12:00 8 CMFN Non-palpable 9 mm mass Intermediate suspicion for carcinoma Initial removal time: 10:29 AM In formalin time: 10:30 AM Marker: [facility] of Service      [facility] [id number] [facility], [place] [address]   Pap Smear, Liquid Based   Collection Time: 01/22/24  2:12 PM  Result Value Ref Range   Case Report      Gynecologic Cytology Report                       Case: [id number]                              Authorizing Provider:  Reymundo Sink, MD            Collected:           01/22/2024 1412             Ordering Location:     [facility]  Received:            01/22/2024 1412                                    [facility]  - [facility]                                                                                                                                                      First Screen:          Curtiss HERO Cox, CT(ASCP)  Specimen:    Pap Liquid-Based Prep, Cervix                                                            Pap Interpretation Negative for intraepithelial lesion or malignancy     Specimen Adequacy      Satisfactory for evaluation, endocervical/transformation zone component present   Additional Information      The Pap test is a screening test used as an aid in detecting cervical cancer and its precursors.  Published data indicates that Pap testing inherently involves both false-negative and false-positive results.  Pap tests are NOT diagnostic procedures and should not be used exclusively for the detection of cervical cancer.  Periodic repeat testing with clinical correlation and follow-up of unexplained clinical signs and symptoms is recommended.    Pap HPV      CoTest (recommended for screening for ages 35-45 years of age)   Pap Gyn History      No pertinent history    LMP 12/26/2023    Attestation Statement      I verify that I have personally reviewed all relevant slides/materials for this case and rendered or confirmed the diagnosis.   Chlamydia / Gonococcus (GC), NAAT   Collection Time: 01/22/24  2:12 PM  Result Value Ref Range   Chlamydia (CT) Negative Negative   Gonorrhea (GC) Negative Negative  Bacterial Vaginosis (BV), Candida, and Trichomonas via NAA (Swab)   Collection Time: 01/22/24  2:12 PM  Result Value Ref Range   Bacterial Vaginosis PCR Negative Negative   Candida Group PCR Negative Negative   Candida Krusei PCR Negative Negative   Candida Glabrata PCR Negative Negative   Trichomonas vaginalis PCR Negative Negative  Human Papilloma Virus (HPV) HR With 16/18 Genotype   Collection Time: 01/22/24  2:12 PM  Result Value Ref Range    HPV 16 Negative Negative   HPV 18 Negative Negative   HPV Other High Risk Negative Display   Methodology       HPI Patient is a pleasant 43 year old female with history of chronic lower back pain who is here today for follow-up of this condition.  She states that her chronic lower back pain is still the same as when she was seen 3 months ago.  It has not worsened.  There is no associated paresthesia, lower limb weakness or fecal/urinary incontinence.  She is not currently taking any medicines for the pain.  She has not yet f/u with an x-ray of her lower back even though it was ordered at her last visit.  She has not been to any chiropractic or physical therapy services.   Of note, review of her chart showed that she had followed up with a biopsy due to a mass in her left breast on mammography-was found to be benign fibrocystic change.     Review of Systems  All other systems reviewed and are negative.   Objective  BP 138/84 (BP Location: Left arm, Patient Position: Sitting)   Pulse 73   Ht 1.651 m (5' 5)   Wt 147 kg (323 lb 4.8 oz)   BMI 53.80 kg/m    Physical Exam Vitals and nursing note reviewed.  Constitutional:      Appearance: Normal appearance.  HENT:     Head: Normocephalic and  atraumatic.  Cardiovascular:     Rate and Rhythm: Normal rate and regular rhythm.     Pulses: Normal pulses.     Heart sounds: Normal heart sounds.  Pulmonary:     Effort: Pulmonary effort is normal.     Breath sounds: Normal breath sounds.  Abdominal:     General: Abdomen is flat.     Palpations: Abdomen is soft.     Tenderness: There is no abdominal tenderness.  Genitourinary:    General: Normal vulva.  Musculoskeletal:        General: Normal range of motion.     Cervical back: Normal range of motion and neck supple.     Comments: Mild tenderness on palpation over the midline of the lumbar spine.  No masses palpated.   Skin:    General: Skin is warm.  Neurological:     General:  No focal deficit present.     Mental Status: She is alert and oriented to person, place, and time.  Psychiatric:        Mood and Affect: Mood normal.        Behavior: Behavior normal.       Assessment/Plan  Diagnoses and all orders for this visit:  Chronic midline low back pain without sciatica -Persistent.  Patient declined starting any pain medicines today.  Referral to physical therapy ordered.  She is to follow-up with x-ray of the lumbar spine as previously evaluated.  Education given.  RTC as needed. -     Ambulatory referral to Physical Therapy; Future  Morbid obesity (CMD) - Education given.  Electronically signed by: Elveria Tedi Kaiser, MD 02/26/2024 8:47 AM

## 2024-06-03 ENCOUNTER — Other Ambulatory Visit: Payer: Self-pay

## 2024-06-03 ENCOUNTER — Emergency Department
Admission: EM | Admit: 2024-06-03 | Discharge: 2024-06-03 | Disposition: A | Discharge status: Home / Self Care | Attending: Emergency Medicine | Admitting: Emergency Medicine

## 2024-06-03 ENCOUNTER — Emergency Department

## 2024-06-03 DIAGNOSIS — R0602 Shortness of breath: Secondary | ICD-10-CM | POA: Insufficient documentation

## 2024-06-03 DIAGNOSIS — R109 Unspecified abdominal pain: Secondary | ICD-10-CM | POA: Insufficient documentation

## 2024-06-03 DIAGNOSIS — R079 Chest pain, unspecified: Secondary | ICD-10-CM | POA: Insufficient documentation

## 2024-06-03 LAB — CBC
HCT: 38.2 % (ref 36.0–46.0)
Hemoglobin: 11.4 g/dL — ABNORMAL LOW (ref 12.0–15.0)
MCH: 24 pg — ABNORMAL LOW (ref 26.0–34.0)
MCHC: 29.8 g/dL — ABNORMAL LOW (ref 30.0–36.0)
MCV: 80.4 fL (ref 80.0–100.0)
Platelets: 409 K/uL — ABNORMAL HIGH (ref 150–400)
RBC: 4.75 MIL/uL (ref 3.87–5.11)
RDW: 18.5 % — ABNORMAL HIGH (ref 11.5–15.5)
WBC: 6.8 K/uL (ref 4.0–10.5)
nRBC: 0 % (ref 0.0–0.2)

## 2024-06-03 LAB — BASIC METABOLIC PANEL WITH GFR
Anion gap: 7 (ref 5–15)
BUN: 14 mg/dL (ref 6–20)
CO2: 25 mmol/L (ref 22–32)
Calcium: 8.9 mg/dL (ref 8.9–10.3)
Chloride: 105 mmol/L (ref 98–111)
Creatinine, Ser: 1.05 mg/dL — ABNORMAL HIGH (ref 0.44–1.00)
GFR, Estimated: >60 mL/min (ref >60)
Glucose, Bld: 102 mg/dL — ABNORMAL HIGH (ref 70–99)
Potassium: 3.9 mmol/L (ref 3.5–5.1)
Sodium: 137 mmol/L (ref 135–145)

## 2024-06-03 LAB — HEPATIC FUNCTION PANEL
ALT: 12 U/L (ref 0–44)
AST: 18 U/L (ref 15–41)
Albumin: 3.4 g/dL — ABNORMAL LOW (ref 3.5–5.0)
Alkaline Phosphatase: 48 U/L (ref 38–126)
Bilirubin, Direct: <0.1 mg/dL (ref 0.0–0.2)
Total Bilirubin: 0.7 mg/dL (ref 0.0–1.2)
Total Protein: 7.1 g/dL (ref 6.5–8.1)

## 2024-06-03 LAB — TROPONIN I (HIGH SENSITIVITY)
Troponin I (High Sensitivity): <2 ng/L (ref <18)
Troponin I (High Sensitivity): <2 ng/L (ref <18)

## 2024-06-03 LAB — LIPASE, BLOOD: Lipase: 32 U/L (ref 11–51)

## 2024-06-03 LAB — D-DIMER, QUANTITATIVE: D-Dimer, Quant: 0.44 ug{FEU}/mL (ref 0.00–0.50)

## 2024-06-03 MED ORDER — KETOROLAC TROMETHAMINE 15 MG/ML IJ SOLN
15.0000 mg | Freq: Once | INTRAMUSCULAR | Status: AC
  Administered 2024-06-03 (×2): 15 mg via INTRAVENOUS
  Filled 2024-06-03: qty 1

## 2024-06-03 NOTE — ED Triage Notes (Signed)
 Pt to ED for upper chest pain since last night, radiating around side to R back. Also pain to L neck. Pain is worse with movement.  Pt in no distress, pain is 4/10. Skin is dry and respirations unlabored. Denies cardiac and pulmonary history.

## 2024-06-03 NOTE — Discharge Instructions (Addendum)
 You are seen in for chest pain and shortness of breath.  You had 2 heart enzymes that were negative and a normal EKG, have a very low suspicion that you are having a heart attack today.  Your chest x-ray was normal.  Your bedside cardiac ultrasound overall appeared normal.  You had a screening test for blood clots and that was also normal.  It is importantly stay hydrated and drink plenty of fluids.  You can alternate Motrin and Tylenol for pain control.  Return to the emergency department if you have any return of symptoms.  Follow-up closely with your primary care physician.  Pain control:  Ibuprofen (motrin/aleve/advil) - You can take 3 tablets (600 mg) every 6 hours as needed for pain/fever.  Acetaminophen (tylenol) - You can take 2 extra strength tablets (1000 mg) every 6 hours as needed for pain/fever.  You can alternate these medications or take them together.  Make sure you eat food/drink water when taking these medications.

## 2024-06-03 NOTE — ED Provider Notes (Signed)
 [facility] Provider Note    Event Date/Time   First MD Initiated Contact with Patient 06/03/24 [phone]     (approximate)   History   Chest Pain   HPI  Shelley Lee is a 43 y.o. female past medical history significant for chronic back pain, uterine fibroids, who presents to the emergency department with abdominal pain and chest pain.  Endorses right sided chest pain that has been ongoing since last night.  States it starts while she was at work.  Mild shortness of breath when she is exerting herself.  Denies prior similar symptoms in the past.  Mild chest pressure pain at this time that she is rating as a 4/10.  Denies nausea, vomiting, diaphoresis.  Chest pain has mildly worsening symptoms with laying back flat and some improvement with sitting forward.  No recent viral illness.  No history of DVT or PE.  No daily medications and no estrogen replacement.  No recent travel or surgery.  No prior abdominal surgery.  Denies cough.  No falls or trauma.  No heavy lifting at work.  Denies any tobacco use.  Family history and a cousin that died at a young age.  Sister who died from a blood clot.     Physical Exam   Triage Vital Signs: ED Triage Vitals  Encounter Vitals Group     BP 06/03/24 0759 (!) 140/77     Girls Systolic BP Percentile --      Girls Diastolic BP Percentile --      Boys Systolic BP Percentile --      Boys Diastolic BP Percentile --      Pulse Rate 06/03/24 0759 76     Resp 06/03/24 0759 20     Temp 06/03/24 0759 99 F (37.2 C)     Temp Source 06/03/24 0759 Oral     SpO2 06/03/24 0759 97 %     Weight 06/03/24 0757 (!) 320 lb (145.2 kg)     Height 06/03/24 0757 5' 5 (1.651 m)     Head Circumference --      Peak Flow --      Pain Score 06/03/24 0756 4     Pain Loc --      Pain Education --      Exclude from Growth Chart --     Most recent vital signs: Vitals:   06/03/24 1030 06/03/24 1100  BP: 114/74 116/79  Pulse: 74 69  Resp: (!)  22 (!) 21  Temp:    SpO2: 99% 92%    Physical Exam Constitutional:      Appearance: She is well-developed.  HENT:     Head: Atraumatic.  Eyes:     Conjunctiva/sclera: Conjunctivae normal.  Cardiovascular:     Rate and Rhythm: Regular rhythm.     Heart sounds: Normal heart sounds.  Pulmonary:     Effort: Pulmonary effort is normal. No respiratory distress.  Abdominal:     General: There is no distension.  Musculoskeletal:        General: Normal range of motion.     Cervical back: Normal range of motion.     Right lower leg: No edema.     Left lower leg: No edema.  Skin:    General: Skin is warm.     Capillary Refill: Capillary refill takes less than 2 seconds.  Neurological:     General: No focal deficit present.     Mental Status: She is alert.  Mental status is at baseline.     IMPRESSION / MDM / ASSESSMENT AND PLAN / ED COURSE  I reviewed the triage vital signs and the nursing notes.  Differential diagnosis including ACS, pneumonia, pericarditis, pulmonary embolism, pneumonia, anxiety, musculoskeletal, referred abdominal pain  EKG  I, Clotilda Punter, the attending physician, personally viewed and interpreted this ECG.   Rate: Normal  Rhythm: Normal sinus  Axis: Normal  Intervals: Normal  ST&T Change: None  No tachycardic or bradycardic dysrhythmias while on cardiac telemetry.  RADIOLOGY I independently reviewed imaging, my interpretation of imaging: Chest x-ray with no signs of pneumonia.  Read as no acute findings.  LABS (all labs ordered are listed, but only abnormal results are displayed) Labs interpreted as -    Labs Reviewed  BASIC METABOLIC PANEL WITH GFR - Abnormal; Notable for the following components:      Result Value   Glucose, Bld 102 (*)    Creatinine, Ser 1.05 (*)    All other components within normal limits  CBC - Abnormal; Notable for the following components:   Hemoglobin 11.4 (*)    MCH 24.0 (*)    MCHC 29.8 (*)    RDW 18.5 (*)     Platelets 409 (*)    All other components within normal limits  HEPATIC FUNCTION PANEL - Abnormal; Notable for the following components:   Albumin 3.4 (*)    All other components within normal limits  LIPASE, BLOOD  D-DIMER, QUANTITATIVE  POC URINE PREG, ED  TROPONIN I (HIGH SENSITIVITY)  TROPONIN I (HIGH SENSITIVITY)     MDM  Lab work overall very reassuring.  No significant leukocytosis.  Anemia but hemoglobin appears to be stable at 11.4 with a baseline of 9.5.  Platelets 409.  Creatinine appears to be at her baseline.  Normal LFTs and lipase.  Serial troponins are negative, heart score of 2, EKG without findings of acute ischemia or dysrhythmia.  Low risk Wells criteria, PERC negative and D-dimer is negative, have low suspicion for pulmonary embolism.  Bedside cardiac ultrasound with no pericardial effusion, no findings on EKG and no obvious rub on exam, clinical picture is not necessarily consistent with a pericarditis.  Have a low suspicion for gastritis or peptic ulcer disease.  No right upper quadrant abdominal tenderness to palpation no abdominal tenderness have a low suspicion for referred pain from her abdomen and normal LFTs and lipase.  On reevaluation states she is feeling better, will follow-up closely with her primary care physician.  Discussed if she had return of symptoms that she needs to return to the emergency department immediately for reevaluation.  Patient expressed understanding and no questions at time of discharge.     PROCEDURES:  Critical Care performed: No  Ultrasound ED Echo  Date/Time: 06/03/2024 11:28 AM  Performed by: Punter Clotilda, MD Authorized by: Punter Clotilda, MD   Procedure details:    Indications: chest pain and dyspnea     Views: subxiphoid, parasternal long axis view, parasternal short axis view, apical 4 chamber view and IVC view     Images: not archived     Limitations:  Positioning and acoustic shadowing Findings:     Pericardium: no pericardial effusion     LV Function: normal (>50% EF)     RV Diameter: normal     IVC: normal   Impression:    Impression: normal     Patient's presentation is most consistent with acute presentation with potential threat to life or bodily function.  MEDICATIONS ORDERED IN ED: Medications  ketorolac  (TORADOL ) 15 MG/ML injection 15 mg (15 mg Intravenous Given 06/03/24 1127)    FINAL CLINICAL IMPRESSION(S) / ED DIAGNOSES   Final diagnoses:  Chest pain, unspecified type     Rx / DC Orders   ED Discharge Orders     None        Note:  This document was prepared using Dragon voice recognition software and may include unintentional dictation errors.   Suzanne Kirsch, MD 06/03/24 [phone]
